# Patient Record
Sex: Female | Born: 1975 | Race: Black or African American | Hispanic: No | Smoking: Former smoker
Health system: Southern US, Community
[De-identification: ages and names within clinical notes are randomized; demographics above are authoritative.]

## PROBLEM LIST (undated history)

## (undated) DIAGNOSIS — I10 Essential (primary) hypertension: Secondary | ICD-10-CM

## (undated) DIAGNOSIS — M1990 Unspecified osteoarthritis, unspecified site: Secondary | ICD-10-CM

## (undated) HISTORY — PX: TUBAL LIGATION: SHX77

---

## 2003-09-22 ENCOUNTER — Emergency Department (HOSPITAL_COMMUNITY): Admission: EM | Admit: 2003-09-22 | Discharge: 2003-09-22 | Payer: Self-pay | Admitting: Emergency Medicine

## 2003-09-24 ENCOUNTER — Emergency Department (HOSPITAL_COMMUNITY): Admission: EM | Admit: 2003-09-24 | Discharge: 2003-09-24 | Payer: Self-pay | Admitting: Emergency Medicine

## 2005-10-19 ENCOUNTER — Other Ambulatory Visit: Admission: RE | Admit: 2005-10-19 | Discharge: 2005-10-19 | Payer: Self-pay | Admitting: Obstetrics and Gynecology

## 2005-10-25 ENCOUNTER — Ambulatory Visit (HOSPITAL_COMMUNITY): Admission: RE | Admit: 2005-10-25 | Discharge: 2005-10-25 | Payer: Self-pay | Admitting: Obstetrics and Gynecology

## 2014-01-28 ENCOUNTER — Emergency Department (HOSPITAL_COMMUNITY)
Admission: EM | Admit: 2014-01-28 | Discharge: 2014-01-28 | Disposition: A | Discharge status: Home / Self Care | Payer: Managed Care, Other (non HMO) | Attending: Emergency Medicine | Admitting: Emergency Medicine

## 2014-01-28 ENCOUNTER — Encounter (HOSPITAL_COMMUNITY): Payer: Self-pay | Admitting: Emergency Medicine

## 2014-01-28 DIAGNOSIS — M545 Low back pain, unspecified: Secondary | ICD-10-CM | POA: Insufficient documentation

## 2014-01-28 DIAGNOSIS — I10 Essential (primary) hypertension: Secondary | ICD-10-CM | POA: Insufficient documentation

## 2014-01-28 DIAGNOSIS — Z87891 Personal history of nicotine dependence: Secondary | ICD-10-CM | POA: Insufficient documentation

## 2014-01-28 DIAGNOSIS — R109 Unspecified abdominal pain: Secondary | ICD-10-CM | POA: Insufficient documentation

## 2014-01-28 DIAGNOSIS — Z7982 Long term (current) use of aspirin: Secondary | ICD-10-CM | POA: Insufficient documentation

## 2014-01-28 DIAGNOSIS — Z3202 Encounter for pregnancy test, result negative: Secondary | ICD-10-CM | POA: Insufficient documentation

## 2014-01-28 DIAGNOSIS — M549 Dorsalgia, unspecified: Secondary | ICD-10-CM

## 2014-01-28 DIAGNOSIS — Z79899 Other long term (current) drug therapy: Secondary | ICD-10-CM | POA: Insufficient documentation

## 2014-01-28 HISTORY — DX: Essential (primary) hypertension: I10

## 2014-01-28 LAB — URINALYSIS, ROUTINE W REFLEX MICROSCOPIC
Bilirubin Urine: NEGATIVE
Glucose, UA: NEGATIVE mg/dL
Hgb urine dipstick: NEGATIVE
Ketones, ur: NEGATIVE mg/dL
Leukocytes, UA: NEGATIVE
Nitrite: NEGATIVE
Protein, ur: NEGATIVE mg/dL
Specific Gravity, Urine: 1.025 (ref 1.005–1.030)
Urobilinogen, UA: 0.2 mg/dL (ref 0.0–1.0)
pH: 6 (ref 5.0–8.0)

## 2014-01-28 LAB — PREGNANCY, URINE: PREG TEST UR: NEGATIVE

## 2014-01-28 MED ORDER — IBUPROFEN 800 MG PO TABS
800.0000 mg | ORAL_TABLET | Freq: Once | ORAL | Status: AC
  Administered 2014-01-28: 800 mg via ORAL
  Filled 2014-01-28: qty 1

## 2014-01-28 MED ORDER — CYCLOBENZAPRINE HCL 10 MG PO TABS: 10.0000 mg | ORAL_TABLET | Freq: Two times a day (BID) | ORAL | Status: DC | PRN

## 2014-01-28 NOTE — ED Notes (Signed)
Pt c/o sharp pain in back for 3 days increased in severity last pm, now decreased. Denies trauma or urinary symptoms. MAE randomly.

## 2014-01-28 NOTE — ED Provider Notes (Signed)
CSN: [id number]     Arrival date & time 01/28/14  0610 History   First MD Initiated Contact with Patient 01/28/14 [phone]     Chief Complaint  Patient presents with  . Back Pain   (Consider location/radiation/quality/duration/timing/severity/associated sxs/prior Treatment) HPI Comments: Patient is a 38 yo F PMHx significant for HTN and uterine fibroids presenting to the ED for three days of waxing and waning low back pain without known precipitating factor. Patient states her pain is worse on the right side. She describes it as dull and achy with occasional sharp shooting pain. She rates her current pain 7/10. Alleviating factors include using a heating pad and sitting up. Patient states her pain is worsened with laying flat and bending over. Denies any numbness or weakness in her extremities, bladder or bowel incontinence, fevers or chills, abdominal pain, nausea, vomiting, diarrhea, vaginal bleeding or discharge, dysuria, urinary frequency or urgency, hx of IVDA or cancer. LMP end of December. Hx of irregular periods.   Patient is a 38 y.o. female presenting with back pain.  Back Pain Associated symptoms: no chest pain and no fever     Past Medical History  Diagnosis Date  . Hypertension    History reviewed. No pertinent past surgical history. Family History  Problem Relation Age of Onset  . Hypertension Mother   . Diabetes Mother   . Heart failure Father    History  Substance Use Topics  . Smoking status: Former Games developer  . Smokeless tobacco: Not on file  . Alcohol Use: 1.2 oz/week    2 Glasses of wine per week   OB History   Grav Para Term Preterm Abortions TAB SAB Ect Mult Living   3 2   1           Review of Systems  Constitutional: Negative for fever and chills.  Respiratory: Negative for shortness of breath.   Cardiovascular: Negative for chest pain.  Gastrointestinal: Negative for nausea and vomiting.  Musculoskeletal: Positive for back pain.  All other systems reviewed  and are negative.    Allergies  Review of patient's allergies indicates no known allergies.  Home Medications   Current Outpatient Rx  Name  Route  Sig  Dispense  Refill  . amLODipine (NORVASC) 10 MG tablet   Oral   Take 10 mg by mouth daily.         Marland Kitchen aspirin EC 81 MG tablet   Oral   Take 81 mg by mouth daily.         . hydrochlorothiazide (HYDRODIURIL) 25 MG tablet   Oral   Take 25 mg by mouth daily.         . cyclobenzaprine (FLEXERIL) 10 MG tablet   Oral   Take 1 tablet (10 mg total) by mouth 2 (two) times daily as needed for muscle spasms.   20 tablet   0    BP 149/86  Pulse 96  Temp(Src) 98.7 F (37.1 C) (Oral)  Resp 16  Ht 5\' 3"  (1.6 m)  Wt 223 lb (101.152 kg)  BMI 39.51 kg/m2  SpO2 98%  LMP 12/24/2013 Physical Exam  Constitutional: She is oriented to person, place, and time. She appears well-developed and well-nourished. No distress.  HENT:  Head: Normocephalic and atraumatic.  Right Ear: External ear normal.  Left Ear: External ear normal.  Nose: Nose normal.  Mouth/Throat: Oropharynx is clear and moist. No oropharyngeal exudate.  Eyes: Conjunctivae and EOM are normal. Pupils are equal, round, and reactive to  light.  Neck: Normal range of motion. Neck supple.  Cardiovascular: Normal rate, regular rhythm, normal heart sounds and intact distal pulses.   Pulmonary/Chest: Effort normal and breath sounds normal. No respiratory distress.  Abdominal: Soft. There is no tenderness. There is CVA tenderness (R sided). There is no rigidity, no rebound and no guarding.  Neurological: She is alert and oriented to person, place, and time. She has normal strength. No cranial nerve deficit or sensory deficit. Gait normal. GCS eye subscore is 4. GCS verbal subscore is 5. GCS motor subscore is 6.  No pronator drift. Bilateral heel-knee-shin intact.  Skin: Skin is warm and dry. She is not diaphoretic.    ED Course  Procedures (including critical care  time) Medications  ibuprofen (ADVIL,MOTRIN) tablet 800 mg (800 mg Oral Given 01/28/14 0738)   Labs Review Labs Reviewed  URINALYSIS, ROUTINE W REFLEX MICROSCOPIC  PREGNANCY, URINE   Imaging Review No results found.  EKG Interpretation   None       MDM   1. Back pain     Filed Vitals:   01/28/14 0616  BP: 149/86  Pulse: 96  Temp: 98.7 F (37.1 C)  Resp: 16   Afebrile, NAD, non-toxic appearing, AAOx4. Patient with back pain.  No neurological deficits and normal neuro exam.  Patient can walk but states is painful.  No loss of bowel or bladder control.  No concern for cauda equina.  No fever, night sweats, weight loss, h/o cancer, IVDU.  Urine reviewed. RICE protocol and pain medicine indicated and discussed with patient. Return precautions discussed. Patient is agreeable to plan. Patient is stable at time of discharge        Jeannetta EllisJennifer L Jackalyn Haith, PA-C 01/28/14 1259

## 2014-01-28 NOTE — Discharge Instructions (Signed)
Please follow up with your primary care physician in 1-2 days. If you do not have one please call the [facility] number listed above. Please take medications as prescribed. Please be aware the Flexeril may make you drowsy. Please read all discharge instructions and return precautions.   Back Pain, Adult Low back pain is very common. About 1 in 5 people have back pain.The cause of low back pain is rarely dangerous. The pain often gets better over time.About half of people with a sudden onset of back pain feel better in just 2 weeks. About 8 in 10 people feel better by 6 weeks.  CAUSES Some common causes of back pain include:  Strain of the muscles or ligaments supporting the spine.  Wear and tear (degeneration) of the spinal discs.  Arthritis.  Direct injury to the back. DIAGNOSIS Most of the time, the direct cause of low back pain is not known.However, back pain can be treated effectively even when the exact cause of the pain is unknown.Answering your caregiver's questions about your overall health and symptoms is one of the most accurate ways to make sure the cause of your pain is not dangerous. If your caregiver needs more information, he or she may order lab work or imaging tests (X-rays or MRIs).However, even if imaging tests show changes in your back, this usually does not require surgery. HOME CARE INSTRUCTIONS For many people, back pain returns.Since low back pain is rarely dangerous, it is often a condition that people can learn to [facility] their own.   Remain active. It is stressful on the back to sit or stand in one place. Do not sit, drive, or stand in one place for more than 30 minutes at a time. Take short walks on level surfaces as soon as pain allows.Try to increase the length of time you walk each day.  Do not stay in bed.Resting more than 1 or 2 days can delay your recovery.  Do not avoid exercise or work.Your body is made to move.It is not  dangerous to be active, even though your back may hurt.Your back will likely heal faster if you return to being active before your pain is gone.  Pay attention to your body when you bend and lift. Many people have less discomfortwhen lifting if they bend their knees, keep the load close to their bodies,and avoid twisting. Often, the most comfortable positions are those that put less stress on your recovering back.  Find a comfortable position to sleep. Use a firm mattress and lie on your side with your knees slightly bent. If you lie on your back, put a pillow under your knees.  Only take over-the-counter or prescription medicines as directed by your caregiver. Over-the-counter medicines to reduce pain and inflammation are often the most helpful.Your caregiver may prescribe muscle relaxant drugs.These medicines help dull your pain so you can more quickly return to your normal activities and healthy exercise.  Put ice on the injured area.  Put ice in a plastic bag.  Place a towel between your skin and the bag.  Leave the ice on for 15-20 minutes, 03-04 times a day for the first 2 to 3 days. After that, ice and heat may be alternated to reduce pain and spasms.  Ask your caregiver about trying back exercises and gentle massage. This may be of some benefit.  Avoid feeling anxious or stressed.Stress increases muscle tension and can worsen back pain.It is important to recognize when you are anxious  or stressed and learn ways to manage it.Exercise is a great option. SEEK MEDICAL CARE IF:  You have pain that is not relieved with rest or medicine.  You have pain that does not improve in 1 week.  You have new symptoms.  You are generally not feeling well. SEEK IMMEDIATE MEDICAL CARE IF:   You have pain that radiates from your back into your legs.  You develop new bowel or bladder control problems.  You have unusual weakness or numbness in your arms or legs.  You develop nausea or  vomiting.  You develop abdominal pain.  You feel faint. Document Released: 12/13/2005 Document Revised: 06/13/2012 Document Reviewed: 05/03/2011 [facility] Patient Information 2014 [employer], [place].

## 2014-01-30 NOTE — ED Provider Notes (Signed)
Medical screening examination/treatment/procedure(s) were performed by non-physician practitioner and as supervising physician I was immediately available for consultation/collaboration.   Jamarkis Branam, MD 01/30/14 1340 

## 2014-10-28 ENCOUNTER — Encounter (HOSPITAL_COMMUNITY): Payer: Self-pay | Admitting: Emergency Medicine

## 2019-12-04 ENCOUNTER — Other Ambulatory Visit: Payer: Self-pay

## 2019-12-04 DIAGNOSIS — Z20822 Contact with and (suspected) exposure to covid-19: Secondary | ICD-10-CM

## 2019-12-06 LAB — NOVEL CORONAVIRUS, NAA: SARS-CoV-2, NAA: NOT DETECTED

## 2020-03-06 ENCOUNTER — Other Ambulatory Visit: Payer: Self-pay | Admitting: Physician Assistant

## 2020-03-06 DIAGNOSIS — Z1231 Encounter for screening mammogram for malignant neoplasm of breast: Secondary | ICD-10-CM

## 2020-03-26 ENCOUNTER — Other Ambulatory Visit: Payer: Self-pay

## 2020-03-26 ENCOUNTER — Ambulatory Visit
Admission: RE | Admit: 2020-03-26 | Discharge: 2020-03-26 | Disposition: A | Discharge status: Home / Self Care | Payer: BC Managed Care – PPO | Source: Ambulatory Visit | Attending: Physician Assistant | Admitting: Physician Assistant

## 2020-03-26 DIAGNOSIS — Z1231 Encounter for screening mammogram for malignant neoplasm of breast: Secondary | ICD-10-CM

## 2022-01-28 ENCOUNTER — Other Ambulatory Visit: Payer: Self-pay

## 2022-01-28 ENCOUNTER — Emergency Department (HOSPITAL_BASED_OUTPATIENT_CLINIC_OR_DEPARTMENT_OTHER)
Admission: EM | Admit: 2022-01-28 | Discharge: 2022-01-28 | Disposition: A | Discharge status: Home / Self Care | Payer: Managed Care, Other (non HMO) | Attending: Emergency Medicine | Admitting: Emergency Medicine

## 2022-01-28 ENCOUNTER — Encounter (HOSPITAL_BASED_OUTPATIENT_CLINIC_OR_DEPARTMENT_OTHER): Payer: Self-pay | Admitting: *Deleted

## 2022-01-28 DIAGNOSIS — Z7982 Long term (current) use of aspirin: Secondary | ICD-10-CM | POA: Diagnosis not present

## 2022-01-28 DIAGNOSIS — Z79899 Other long term (current) drug therapy: Secondary | ICD-10-CM | POA: Diagnosis not present

## 2022-01-28 DIAGNOSIS — I10 Essential (primary) hypertension: Secondary | ICD-10-CM | POA: Diagnosis present

## 2022-01-28 DIAGNOSIS — E876 Hypokalemia: Secondary | ICD-10-CM | POA: Insufficient documentation

## 2022-01-28 LAB — CBC WITH DIFFERENTIAL/PLATELET
Abs Immature Granulocytes: 0.01 10*3/uL (ref 0.00–0.07)
Basophils Absolute: 0 10*3/uL (ref 0.0–0.1)
Basophils Relative: 1 %
Eosinophils Absolute: 0.2 10*3/uL (ref 0.0–0.5)
Eosinophils Relative: 4 %
HCT: 36.8 % (ref 36.0–46.0)
Hemoglobin: 11.5 g/dL — ABNORMAL LOW (ref 12.0–15.0)
Immature Granulocytes: 0 %
Lymphocytes Relative: 33 %
Lymphs Abs: 1.8 10*3/uL (ref 0.7–4.0)
MCH: 23.6 pg — ABNORMAL LOW (ref 26.0–34.0)
MCHC: 31.3 g/dL (ref 30.0–36.0)
MCV: 75.4 fL — ABNORMAL LOW (ref 80.0–100.0)
Monocytes Absolute: 0.6 10*3/uL (ref 0.1–1.0)
Monocytes Relative: 11 %
Neutro Abs: 2.9 10*3/uL (ref 1.7–7.7)
Neutrophils Relative %: 51 %
Platelets: 386 10*3/uL (ref 150–400)
RBC: 4.88 MIL/uL (ref 3.87–5.11)
RDW: 15.1 % (ref 11.5–15.5)
WBC: 5.5 10*3/uL (ref 4.0–10.5)
nRBC: 0 % (ref 0.0–0.2)

## 2022-01-28 LAB — TROPONIN I (HIGH SENSITIVITY): Troponin I (High Sensitivity): 8 ng/L (ref <18)

## 2022-01-28 LAB — BASIC METABOLIC PANEL
Anion gap: 8 (ref 5–15)
BUN: 18 mg/dL (ref 6–20)
CO2: 28 mmol/L (ref 22–32)
Calcium: 8.8 mg/dL — ABNORMAL LOW (ref 8.9–10.3)
Chloride: 100 mmol/L (ref 98–111)
Creatinine, Ser: 0.97 mg/dL (ref 0.44–1.00)
GFR, Estimated: >60 mL/min (ref >60)
Glucose, Bld: 126 mg/dL — ABNORMAL HIGH (ref 70–99)
Potassium: 2.9 mmol/L — ABNORMAL LOW (ref 3.5–5.1)
Sodium: 136 mmol/L (ref 135–145)

## 2022-01-28 MED ORDER — LISINOPRIL 10 MG PO TABS
20.0000 mg | ORAL_TABLET | Freq: Once | ORAL | Status: AC
  Administered 2022-01-28: 20 mg via ORAL
  Filled 2022-01-28: qty 2

## 2022-01-28 MED ORDER — HYDROCHLOROTHIAZIDE 25 MG PO TABS
25.0000 mg | ORAL_TABLET | Freq: Once | ORAL | Status: AC
  Administered 2022-01-28: 25 mg via ORAL
  Filled 2022-01-28: qty 1

## 2022-01-28 MED ORDER — AMLODIPINE BESYLATE 5 MG PO TABS
5.0000 mg | ORAL_TABLET | Freq: Once | ORAL | Status: AC
  Administered 2022-01-28: 5 mg via ORAL
  Filled 2022-01-28: qty 1

## 2022-01-28 MED ORDER — LISINOPRIL-HYDROCHLOROTHIAZIDE 20-25 MG PO TABS: 1.0000 | ORAL_TABLET | Freq: Every day | ORAL | 0 refills | Status: DC

## 2022-01-28 MED ORDER — POTASSIUM CHLORIDE CRYS ER 20 MEQ PO TBCR
40.0000 meq | EXTENDED_RELEASE_TABLET | Freq: Once | ORAL | Status: AC
  Administered 2022-01-28: 40 meq via ORAL
  Filled 2022-01-28: qty 2

## 2022-01-28 MED ORDER — POTASSIUM CHLORIDE ER 10 MEQ PO TBCR: 10.0000 meq | EXTENDED_RELEASE_TABLET | Freq: Every day | ORAL | 0 refills | Status: DC

## 2022-01-28 MED ORDER — AMLODIPINE BESYLATE 5 MG PO TABS: 5.0000 mg | ORAL_TABLET | Freq: Every day | ORAL | 0 refills | Status: DC

## 2022-01-28 NOTE — Discharge Instructions (Signed)
Please pick up blood pressure medication and take as prescribed daily.   Keep appointment with your PCP on Monday for further evaluation. You should also have your potassium level rechecked at that time given it was slightly low today. Take the oral potassium supplements prescribed to you.   Return to the ED for any new/worsening symptoms

## 2022-01-28 NOTE — ED Provider Notes (Signed)
[facility] EMERGENCY DEPARTMENT Provider Note   CSN: [id number] Arrival date & time: 01/28/22  1808     History  Chief Complaint  Patient presents with   Hypertension    Martha Thomas is a 46 y.o. female with Pmhx HTN who presents to the ED from PCP's office for high blood pressure.  Patient reports she had issues with insurance and ran out of her medication 6 months ago.  She recently got a new job and got new insurance.  She went to her PCP today was noted to be hypertensive and sent to the ED for further evaluation.  Blood pressure 229/131 on arrival today.  Patient has no complaints at this time.  She denies any headache, vision changes, chest pain, shortness of breath, abdominal pain, urinary symptoms, any other associated symptoms.  The history is provided by the patient and medical records.      Home Medications Prior to Admission medications   Medication Sig Start Date End Date Taking? Authorizing Provider  amLODipine (NORVASC) 5 MG tablet Take 1 tablet (5 mg total) by mouth daily. 01/28/22 02/27/22 Yes Jordana Dugue, PA-C  lisinopril-hydrochlorothiazide (ZESTORETIC) 20-25 MG tablet Take 1 tablet by mouth daily. 01/28/22 02/27/22 Yes Buffey Zabinski, PA-C  potassium chloride (KLOR-CON) 10 MEQ tablet Take 1 tablet (10 mEq total) by mouth daily for 5 days. 01/28/22 02/02/22 Yes Callan Norden, PA-C  amLODipine (NORVASC) 10 MG tablet Take 10 mg by mouth daily.    [provider]  aspirin EC 81 MG tablet Take 81 mg by mouth daily.    [provider]  cyclobenzaprine (FLEXERIL) 10 MG tablet Take 1 tablet (10 mg total) by mouth 2 (two) times daily as needed for muscle spasms. 01/28/14   Piepenbrink, Anderson Malta, PA-C  hydrochlorothiazide (HYDRODIURIL) 25 MG tablet Take 25 mg by mouth daily.    [provider]      Allergies    Patient has no known allergies.    Review of Systems   Review of Systems  Constitutional:  Negative for chills and fever.   Eyes:  Negative for visual disturbance.  Respiratory:  Negative for shortness of breath.   Cardiovascular:  Negative for chest pain.  Gastrointestinal:  Negative for abdominal pain, nausea and vomiting.  Neurological:  Negative for dizziness and headaches.  All other systems reviewed and are negative.  Physical Exam Updated Vital Signs BP (!) 191/109    Pulse 84    Temp 99 F (37.2 C) (Oral)    Resp 18    Ht 5\' 3"  (1.6 m)    Wt 98.9 kg    LMP 01/21/2022    SpO2 100%    BMI 38.62 kg/m  Physical Exam Vitals and nursing note reviewed.  Constitutional:      Appearance: She is not ill-appearing or diaphoretic.  HENT:     Head: Normocephalic and atraumatic.  Eyes:     Conjunctiva/sclera: Conjunctivae normal.  Cardiovascular:     Rate and Rhythm: Normal rate and regular rhythm.     Pulses: Normal pulses.  Pulmonary:     Effort: Pulmonary effort is normal.     Breath sounds: Normal breath sounds. No wheezing, rhonchi or rales.  Abdominal:     Palpations: Abdomen is soft.     Tenderness: There is no abdominal tenderness.  Musculoskeletal:     Cervical back: Neck supple.  Skin:    General: Skin is warm and dry.  Neurological:     Mental Status: She  is alert.    ED Results / Procedures / Treatments   Labs (all labs ordered are listed, but only abnormal results are displayed) Labs Reviewed  CBC WITH DIFFERENTIAL/PLATELET - Abnormal; Notable for the following components:      Result Value   Hemoglobin 11.5 (*)    MCV 75.4 (*)    MCH 23.6 (*)    All other components within normal limits  BASIC METABOLIC PANEL - Abnormal; Notable for the following components:   Potassium 2.9 (*)    Glucose, Bld 126 (*)    Calcium 8.8 (*)    All other components within normal limits  TROPONIN I (HIGH SENSITIVITY)  TROPONIN I (HIGH SENSITIVITY)    EKG None  Radiology No results found.  Procedures Procedures    Medications Ordered in ED Medications  amLODipine (NORVASC) tablet 5 mg  (5 mg Oral Given 01/28/22 1840)  lisinopril (ZESTRIL) tablet 20 mg (20 mg Oral Given 01/28/22 1839)  hydrochlorothiazide (HYDRODIURIL) tablet 25 mg (25 mg Oral Given 01/28/22 1840)  potassium chloride SA (KLOR-CON M) CR tablet 40 mEq (40 mEq Oral Given 01/28/22 1931)    ED Course/ Medical Decision Making/ A&P                           Medical Decision Making 46 year old female presenting to the ED with elevated blood pressure readings. Have been off of her Amlodipine 5 mg and Lisinopril HCTZ 20-25 mg for 6 months. Asymptomatic at this time. On arrival to the ED BP 229/131. Will workup for asymptomatic HTN/hypertensive urgency at this time with CBC, BMP, troponin. Will provide doses of amlodipine, lisinopril, and HCTZ.   Problems Addressed: Hypokalemia: acute illness or injury    Details: Provided with KDUR in the ED and discharged home with same. Pt instructed to follow up with PCP next week for recheck of potassium. Primary hypertension: acute illness or injury    Details: Significantly elevated in the ED today however improved from preavious at 229/131 to 180/94. Suspect related to being off of BP meds for > 6 months. No signs of acute organ damage and pt without symptoms at this time to suggest HTN urgency/emergency. Pt to be started back on her amlodipine 5 mg and lisinopril hctz 20-25 mg with PCP follow up.  Amount and/or Complexity of Data Reviewed Labs: ordered.    Details: CBC without leukocytosis. Hgb stable at 11.5 CMP with potassium 2.9. No other electrolyte abnormalities. KDUR provided in the ED. Pt without any vomiting or diarrhea to suggest GI loss.  Troponin of 8. Do not feel pt requires repeat troponin testing. ECG/medicine tests: ordered.    Details: EKG with nonspecific ST changes. Pt without complaints of chest pain.  Risk Prescription drug management.          Final Clinical Impression(s) / ED Diagnoses Final diagnoses:  Primary hypertension  Hypokalemia    Rx /  DC Orders ED Discharge Orders          Ordered    amLODipine (NORVASC) 5 MG tablet  Daily        01/28/22 2020    lisinopril-hydrochlorothiazide (ZESTORETIC) 20-25 MG tablet  Daily        01/28/22 2020    potassium chloride (KLOR-CON) 10 MEQ tablet  Daily        01/28/22 2020             Discharge Instructions      Please  pick up blood pressure medication and take as prescribed daily.   Keep appointment with your PCP on Monday for further evaluation. You should also have your potassium level rechecked at that time given it was slightly low today. Take the oral potassium supplements prescribed to you.   Return to the ED for any new/worsening symptoms        Eustaquio Maize, Hershal Coria 01/28/22 2021    Varney Biles, MD 01/30/22 1421

## 2022-01-28 NOTE — ED Triage Notes (Signed)
She ran out her BP medications over 6 months ago. She was seen by her MD today for a check up and her BP was elevated. She was told to come to the ER. No symptoms. EKG at triage.

## 2022-03-28 ENCOUNTER — Emergency Department (HOSPITAL_BASED_OUTPATIENT_CLINIC_OR_DEPARTMENT_OTHER): Payer: Managed Care, Other (non HMO)

## 2022-03-28 ENCOUNTER — Emergency Department (HOSPITAL_BASED_OUTPATIENT_CLINIC_OR_DEPARTMENT_OTHER)
Admission: EM | Admit: 2022-03-28 | Discharge: 2022-03-28 | Disposition: A | Discharge status: Home / Self Care | Payer: Managed Care, Other (non HMO) | Attending: Emergency Medicine | Admitting: Emergency Medicine

## 2022-03-28 ENCOUNTER — Encounter (HOSPITAL_BASED_OUTPATIENT_CLINIC_OR_DEPARTMENT_OTHER): Payer: Self-pay | Admitting: Emergency Medicine

## 2022-03-28 ENCOUNTER — Other Ambulatory Visit: Payer: Self-pay

## 2022-03-28 DIAGNOSIS — M25561 Pain in right knee: Secondary | ICD-10-CM

## 2022-03-28 DIAGNOSIS — M25461 Effusion, right knee: Secondary | ICD-10-CM | POA: Diagnosis not present

## 2022-03-28 DIAGNOSIS — X500XXA Overexertion from strenuous movement or load, initial encounter: Secondary | ICD-10-CM | POA: Insufficient documentation

## 2022-03-28 DIAGNOSIS — Y990 Civilian activity done for income or pay: Secondary | ICD-10-CM | POA: Diagnosis not present

## 2022-03-28 DIAGNOSIS — Z7982 Long term (current) use of aspirin: Secondary | ICD-10-CM | POA: Insufficient documentation

## 2022-03-28 MED ORDER — DEXAMETHASONE SODIUM PHOSPHATE 10 MG/ML IJ SOLN
10.0000 mg | Freq: Once | INTRAMUSCULAR | Status: AC
  Administered 2022-03-28: 10 mg via INTRAMUSCULAR
  Filled 2022-03-28: qty 1

## 2022-03-28 NOTE — ED Provider Notes (Signed)
?[facility] EMERGENCY DEPARTMENT ?Provider Note ? ? ?CSN: [id number] ?Arrival date & time: 03/28/22  1332 ? ?  ? ?History ? ?Chief Complaint  ?Patient presents with  ? Knee Pain  ? ? ?Martha Thomas is a 46 y.o. female presented emerged department with pain in her right knee.  Patient reports she has chronic pain in both knees due to osteoarthritis, sees an orthopedic doctor and was told that she may need a left knee replacement.  She tends to favor the right knee.  Yesterday at work she felt that her right knee "gave out".  She felt the knee had buckled.  She does work at [employer] and is reportedly up and down on her feet all day at work.  She has swelling and pain in the right knee.  In the past she has had steroid injections into the knee that provided some relief.  She takes Tylenol for pain, does not take ibuprofen because "it makes my blood pressure go up". ? ?HPI ? ?  ? ?Home Medications ?Prior to Admission medications   ?Medication Sig Start Date End Date Taking? Authorizing Provider  ?amLODipine (NORVASC) 10 MG tablet Take 10 mg by mouth daily.    [provider]  ?amLODipine (NORVASC) 5 MG tablet Take 1 tablet (5 mg total) by mouth daily. 01/28/22 02/27/22  Tanda Rockers, PA-C  ?aspirin EC 81 MG tablet Take 81 mg by mouth daily.    [provider]  ?cyclobenzaprine (FLEXERIL) 10 MG tablet Take 1 tablet (10 mg total) by mouth 2 (two) times daily as needed for muscle spasms. 01/28/14   Piepenbrink, Victorino Dike, PA-C  ?hydrochlorothiazide (HYDRODIURIL) 25 MG tablet Take 25 mg by mouth daily.    [provider]  ?lisinopril-hydrochlorothiazide (ZESTORETIC) 20-25 MG tablet Take 1 tablet by mouth daily. 01/28/22 02/27/22  Tanda Rockers, PA-C  ?potassium chloride (KLOR-CON) 10 MEQ tablet Take 1 tablet (10 mEq total) by mouth daily for 5 days. 01/28/22 02/02/22  Tanda Rockers, PA-C  ?   ? ?Allergies    ?Patient has no known allergies.   ? ?Review of Systems   ?Review of Systems ? ?Physical  Exam ?Updated Vital Signs ?BP (!) 152/84 (BP Location: Right Arm)   Pulse 75   Temp 98.2 ?F (36.8 ?C) (Oral)   Resp 15   SpO2 100%  ?Physical Exam ?Constitutional:   ?   General: She is not in acute distress. ?HENT:  ?   Head: Normocephalic and atraumatic.  ?Eyes:  ?   Conjunctiva/sclera: Conjunctivae normal.  ?   Pupils: Pupils are equal, round, and reactive to light.  ?Cardiovascular:  ?   Rate and Rhythm: Normal rate and regular rhythm.  ?Pulmonary:  ?   Effort: Pulmonary effort is normal. No respiratory distress.  ?Abdominal:  ?   Tenderness: There is no abdominal tenderness.  ?Musculoskeletal:  ?   Comments: [place] knee criteria negative, effusion of moderate size the right knee, full range of motion actively and passively with the right knee.  ?Skin: ?   General: Skin is warm and dry.  ?Neurological:  ?   General: No focal deficit present.  ?   Mental Status: She is alert. Mental status is at baseline.  ?Psychiatric:     ?   Mood and Affect: Mood normal.     ?   Behavior: Behavior normal.  ? ? ?ED Results / Procedures / Treatments   ?Labs ?(all labs ordered are listed, but only abnormal results are displayed) ?  Labs Reviewed - No data to display ? ?EKG ?None ? ?Radiology ?DG Knee Complete 4 Views Right ? ?Result Date: 03/28/2022 ?CLINICAL DATA:  Intermittent knee pain. EXAM: RIGHT KNEE - COMPLETE 4+ VIEW COMPARISON:  None. FINDINGS: No evidence of fracture or dislocation. Moderate suprapatellar joint effusion. Mild tricompartmental osteoarthritis. No focal osseous lesion. IMPRESSION: 1. No acute osseous abnormality. 2. Moderate suprapatellar joint effusion. 3. Mild tricompartmental osteoarthritis. Electronically Signed   By: Sherron Ales M.D.   On: 03/28/2022 14:14   ? ?Procedures ?Procedures  ? ? ?Medications Ordered in ED ?Medications  ?dexamethasone (DECADRON) injection 10 mg (10 mg Intramuscular Given 03/28/22 1458)  ? ? ?ED Course/ Medical Decision Making/ A&P ?  ?                        ?Medical Decision  Making ?Amount and/or Complexity of Data Reviewed ?Radiology: ordered. ? ?Risk ?Prescription drug management. ? ? ?Patient is here with right knee pain and effusion ?X-rays ordered at triage and personally reviewed showing tricompartmental osteoarthritis, moderate effusion, no evident fracture. ?Low suspicion for ACL or PCL tear, no laxity of the joint ?Highly doubt septic joint, no warmth or erythema or fevers, no significant tenderness or pain with range of motion testing. ? ?Advised a compressive sleeve for the right knee, we can give a Decadron shot for additional pain control, advised ice and elevation and compression at home.  A work note will be provided so she can rest this knee.  She will follow-up with the orthopedic doctor.  She verbalized understanding ? ? ? ? ? ? ? ?Final Clinical Impression(s) / ED Diagnoses ?Final diagnoses:  ?Acute pain of right knee  ?Effusion of right knee  ? ? ?Rx / DC Orders ?ED Discharge Orders   ? ? None  ? ?  ? ? ?  ?Terald Sleeper, MD ?03/28/22 1525 ? ?

## 2022-03-28 NOTE — ED Triage Notes (Signed)
Pt reports intermittent R knee pain "for a while." Yesterday knee gave out on her. Pt ambulatory to triage. ?

## 2022-03-28 NOTE — Discharge Instructions (Signed)
You should put ice on your knee at home and try to elevate it, using ice or cold packs for 10 minutes at a time.  Follow-up with your orthopedic doctor in the office tomorrow.  You can take Tylenol at home as needed for pain, we gave you a shot of steroids this may also help with inflammation and pain.  Please consider buying a compressive sleeve for support sleeve for your knee over-the-counter at a pharmacy. ?

## 2023-07-14 ENCOUNTER — Other Ambulatory Visit: Payer: Self-pay

## 2023-07-14 ENCOUNTER — Encounter (HOSPITAL_BASED_OUTPATIENT_CLINIC_OR_DEPARTMENT_OTHER): Payer: Self-pay

## 2023-07-14 ENCOUNTER — Emergency Department (HOSPITAL_BASED_OUTPATIENT_CLINIC_OR_DEPARTMENT_OTHER)
Admission: EM | Admit: 2023-07-14 | Discharge: 2023-07-14 | Disposition: A | Discharge status: Home / Self Care | Payer: 59 | Attending: Emergency Medicine | Admitting: Emergency Medicine

## 2023-07-14 DIAGNOSIS — Z20822 Contact with and (suspected) exposure to covid-19: Secondary | ICD-10-CM | POA: Insufficient documentation

## 2023-07-14 DIAGNOSIS — H6691 Otitis media, unspecified, right ear: Secondary | ICD-10-CM | POA: Diagnosis not present

## 2023-07-14 DIAGNOSIS — H9201 Otalgia, right ear: Secondary | ICD-10-CM | POA: Diagnosis present

## 2023-07-14 DIAGNOSIS — Z7982 Long term (current) use of aspirin: Secondary | ICD-10-CM | POA: Insufficient documentation

## 2023-07-14 DIAGNOSIS — J029 Acute pharyngitis, unspecified: Secondary | ICD-10-CM | POA: Insufficient documentation

## 2023-07-14 LAB — RESP PANEL BY RT-PCR (RSV, FLU A&B, COVID)  RVPGX2
Influenza A by PCR: NEGATIVE
Influenza B by PCR: NEGATIVE
Resp Syncytial Virus by PCR: NEGATIVE
SARS Coronavirus 2 by RT PCR: NEGATIVE

## 2023-07-14 LAB — GROUP A STREP BY PCR: Group A Strep by PCR: NOT DETECTED

## 2023-07-14 MED ORDER — AMOXICILLIN 500 MG PO CAPS
500.0000 mg | ORAL_CAPSULE | Freq: Once | ORAL | Status: AC
  Administered 2023-07-14: 500 mg via ORAL
  Filled 2023-07-14: qty 1

## 2023-07-14 MED ORDER — AMOXICILLIN 500 MG PO CAPS: 500.0000 mg | ORAL_CAPSULE | Freq: Three times a day (TID) | ORAL | 0 refills | Status: DC

## 2023-07-14 NOTE — Discharge Instructions (Addendum)
Return if any problems.  See your Physician for recheck in 1 week   °

## 2023-07-14 NOTE — ED Triage Notes (Signed)
Patient here POV from Home.  Endorses Sore Throat, Bilateral Otalgia, Productive Cough, headache for about 1 week. No Aches or Fever.   NAD Noted during Triage. A&Ox4. Gcs 15. Ambulatory.

## 2023-07-15 NOTE — ED Provider Notes (Signed)
[place] EMERGENCY DEPARTMENT AT [facility] Provider Note   CSN: [id number] Arrival date & time: 07/14/23  2016     History  Chief Complaint  Patient presents with   Sore Throat    Martha Thomas is a 47 y.o. female.  Patient complains of a sore throat and right ear pain.  Patient reports that she has had symptoms for the past week.  Patient was exposed to a child with a viral illness.  Patient reports no fever no chills.  She does not have any shortness of breath.  The history is provided by the patient. No language interpreter was used.  Sore Throat This is a new problem. The problem occurs constantly. Nothing aggravates the symptoms. Nothing relieves the symptoms. She has tried nothing for the symptoms.       Home Medications Prior to Admission medications   Medication Sig Start Date End Date Taking? Authorizing Provider  amoxicillin (AMOXIL) 500 MG capsule Take 1 capsule (500 mg total) by mouth 3 (three) times daily. 07/14/23  Yes Cheron Schaumann K, PA-C  amLODipine (NORVASC) 10 MG tablet Take 10 mg by mouth daily.    [provider]  amLODipine (NORVASC) 5 MG tablet Take 1 tablet (5 mg total) by mouth daily. 01/28/22 02/27/22  Tanda Rockers, PA-C  aspirin EC 81 MG tablet Take 81 mg by mouth daily.    [provider]  cyclobenzaprine (FLEXERIL) 10 MG tablet Take 1 tablet (10 mg total) by mouth 2 (two) times daily as needed for muscle spasms. 01/28/14   Piepenbrink, Victorino Dike, PA-C  hydrochlorothiazide (HYDRODIURIL) 25 MG tablet Take 25 mg by mouth daily.    [provider]  lisinopril-hydrochlorothiazide (ZESTORETIC) 20-25 MG tablet Take 1 tablet by mouth daily. 01/28/22 02/27/22  Hyman Hopes, Margaux, PA-C  potassium chloride (KLOR-CON) 10 MEQ tablet Take 1 tablet (10 mEq total) by mouth daily for 5 days. 01/28/22 02/02/22  Tanda Rockers, PA-C      Allergies    Patient has no known allergies.    Review of Systems   Review of Systems  HENT:   Positive for ear pain and sore throat.   All other systems reviewed and are negative.   Physical Exam Updated Vital Signs BP (!) 182/111   Pulse 79   Temp 98.8 F (37.1 C)   Resp 20   Ht 5\' 3"  (1.6 m)   Wt 103.4 kg   SpO2 99%   BMI 40.39 kg/m  Physical Exam Vitals and nursing note reviewed.  Constitutional:      Appearance: She is well-developed.  HENT:     Head: Normocephalic.     Right Ear: Tympanic membrane is erythematous.     Mouth/Throat:     Pharynx: Pharyngeal swelling and posterior oropharyngeal erythema present.     Tonsils: No tonsillar exudate.  Pulmonary:     Effort: Pulmonary effort is normal.  Abdominal:     General: There is no distension.     Palpations: Abdomen is soft.  Musculoskeletal:        General: Normal range of motion.     Cervical back: Normal range of motion.  Skin:    General: Skin is warm.  Neurological:     Mental Status: She is alert and oriented to person, place, and time.  Psychiatric:        Mood and Affect: Mood normal.     ED Results / Procedures / Treatments   Labs (all labs ordered are listed, but only  abnormal results are displayed) Labs Reviewed  GROUP A STREP BY PCR  RESP PANEL BY RT-PCR (RSV, FLU A&B, COVID)  [id number]    EKG None  Radiology No results found.  Procedures Procedures    Medications Ordered in ED Medications  amoxicillin (AMOXIL) capsule 500 mg (500 mg Oral Given 07/14/23 2147)    ED Course/ Medical Decision Making/ A&P Clinical Course as of 07/15/23 1638  Thu Jul 14, 2023  2045 Resp panel by RT-PCR (RSV, Flu A&B, Covid) Anterior Nasal Swab [NF]  2045 Group A Strep by PCR [NF]    Clinical Course User Index [NF] Rejeana Brock, [place]                             Medical Decision Making Patient complains of a sore throat for the past week.  Patient complains of pain in her right ear  Amount and/or Complexity of Data Reviewed Labs: ordered. Decision-making details documented in ED  Course.    Details: Labs ordered reviewed and interpreted strep is negative.  Flu and COVID are negative  Risk Prescription drug management. Risk Details: Patient has a bulging red right tympanic membrane.  I discussed findings with patient I will treat her with amoxicillin 500 mg for 10 days.  Patient is advised to follow-up with her primary care physician for recheck.           Final Clinical Impression(s) / ED Diagnoses Final diagnoses:  Right otitis media, unspecified otitis media type    Rx / DC Orders ED Discharge Orders          Ordered    amoxicillin (AMOXIL) 500 MG capsule  3 times daily        07/14/23 2126           An After Visit Summary was printed and given to the patient.    Elson Areas, [place] 07/15/23 1642    Glyn Ade, MD 07/18/23 [phone]

## 2023-09-11 ENCOUNTER — Other Ambulatory Visit: Payer: Self-pay

## 2023-09-11 ENCOUNTER — Encounter (HOSPITAL_COMMUNITY): Payer: Self-pay

## 2023-09-11 ENCOUNTER — Emergency Department (HOSPITAL_COMMUNITY)
Admission: EM | Admit: 2023-09-11 | Discharge: 2023-09-11 | Disposition: A | Discharge status: Home / Self Care | Payer: 59 | Attending: Emergency Medicine | Admitting: Emergency Medicine

## 2023-09-11 ENCOUNTER — Emergency Department (HOSPITAL_COMMUNITY): Payer: 59

## 2023-09-11 DIAGNOSIS — I159 Secondary hypertension, unspecified: Secondary | ICD-10-CM | POA: Diagnosis not present

## 2023-09-11 DIAGNOSIS — Z79899 Other long term (current) drug therapy: Secondary | ICD-10-CM | POA: Insufficient documentation

## 2023-09-11 DIAGNOSIS — E876 Hypokalemia: Secondary | ICD-10-CM | POA: Insufficient documentation

## 2023-09-11 DIAGNOSIS — Z7982 Long term (current) use of aspirin: Secondary | ICD-10-CM | POA: Insufficient documentation

## 2023-09-11 DIAGNOSIS — R55 Syncope and collapse: Secondary | ICD-10-CM | POA: Diagnosis present

## 2023-09-11 DIAGNOSIS — I10 Essential (primary) hypertension: Secondary | ICD-10-CM | POA: Insufficient documentation

## 2023-09-11 LAB — COMPREHENSIVE METABOLIC PANEL
ALT: 16 U/L (ref 0–44)
AST: 21 U/L (ref 15–41)
Albumin: 3.2 g/dL — ABNORMAL LOW (ref 3.5–5.0)
Alkaline Phosphatase: 46 U/L (ref 38–126)
Anion gap: 8 (ref 5–15)
BUN: 12 mg/dL (ref 6–20)
CO2: 29 mmol/L (ref 22–32)
Calcium: 8.7 mg/dL — ABNORMAL LOW (ref 8.9–10.3)
Chloride: 101 mmol/L (ref 98–111)
Creatinine, Ser: 1.19 mg/dL — ABNORMAL HIGH (ref 0.44–1.00)
GFR, Estimated: 56 mL/min — ABNORMAL LOW (ref >60)
Glucose, Bld: 95 mg/dL (ref 70–99)
Potassium: 3 mmol/L — ABNORMAL LOW (ref 3.5–5.1)
Sodium: 138 mmol/L (ref 135–145)
Total Bilirubin: 0.3 mg/dL (ref 0.3–1.2)
Total Protein: 6.6 g/dL (ref 6.5–8.1)

## 2023-09-11 LAB — TROPONIN I (HIGH SENSITIVITY)
Troponin I (High Sensitivity): 14 ng/L (ref <18)
Troponin I (High Sensitivity): 16 ng/L (ref <18)

## 2023-09-11 LAB — CBC
HCT: 33.2 % — ABNORMAL LOW (ref 36.0–46.0)
Hemoglobin: 9.7 g/dL — ABNORMAL LOW (ref 12.0–15.0)
MCH: 21.3 pg — ABNORMAL LOW (ref 26.0–34.0)
MCHC: 29.2 g/dL — ABNORMAL LOW (ref 30.0–36.0)
MCV: 72.8 fL — ABNORMAL LOW (ref 80.0–100.0)
Platelets: 409 10*3/uL — ABNORMAL HIGH (ref 150–400)
RBC: 4.56 MIL/uL (ref 3.87–5.11)
RDW: 16.3 % — ABNORMAL HIGH (ref 11.5–15.5)
WBC: 5.7 10*3/uL (ref 4.0–10.5)
nRBC: 0 % (ref 0.0–0.2)

## 2023-09-11 LAB — MAGNESIUM: Magnesium: 1.9 mg/dL (ref 1.7–2.4)

## 2023-09-11 MED ORDER — POTASSIUM CHLORIDE 20 MEQ PO PACK
20.0000 meq | PACK | Freq: Once | ORAL | Status: AC
  Administered 2023-09-11: 20 meq via ORAL
  Filled 2023-09-11: qty 1

## 2023-09-11 MED ORDER — METOPROLOL TARTRATE 5 MG/5ML IV SOLN
5.0000 mg | Freq: Once | INTRAVENOUS | Status: AC
  Administered 2023-09-11: 5 mg via INTRAVENOUS
  Filled 2023-09-11: qty 5

## 2023-09-11 MED ORDER — LISINOPRIL 20 MG PO TABS: 20.0000 mg | ORAL_TABLET | Freq: Every day | ORAL | 0 refills | Status: DC

## 2023-09-11 MED ORDER — LISINOPRIL 20 MG PO TABS
20.0000 mg | ORAL_TABLET | Freq: Once | ORAL | Status: AC
  Administered 2023-09-11: 20 mg via ORAL
  Filled 2023-09-11: qty 1

## 2023-09-11 MED ORDER — MAGNESIUM OXIDE -MG SUPPLEMENT 400 (240 MG) MG PO TABS
400.0000 mg | ORAL_TABLET | Freq: Once | ORAL | Status: AC
  Administered 2023-09-11: 400 mg via ORAL
  Filled 2023-09-11: qty 1

## 2023-09-11 MED ORDER — POTASSIUM CHLORIDE ER 10 MEQ PO TBCR: 10.0000 meq | EXTENDED_RELEASE_TABLET | Freq: Every day | ORAL | 0 refills | Status: DC

## 2023-09-11 MED ORDER — ASPIRIN 81 MG PO CHEW
324.0000 mg | CHEWABLE_TABLET | Freq: Once | ORAL | Status: AC
  Administered 2023-09-11: 324 mg via ORAL
  Filled 2023-09-11: qty 4

## 2023-09-11 MED ORDER — POTASSIUM CHLORIDE CRYS ER 20 MEQ PO TBCR
40.0000 meq | EXTENDED_RELEASE_TABLET | Freq: Once | ORAL | Status: AC
  Administered 2023-09-11: 40 meq via ORAL
  Filled 2023-09-11: qty 2

## 2023-09-11 MED ORDER — HYDROCHLOROTHIAZIDE 25 MG PO TABS: 25.0000 mg | ORAL_TABLET | Freq: Every day | ORAL | 0 refills | Status: AC

## 2023-09-11 NOTE — ED Provider Notes (Signed)
[place] EMERGENCY DEPARTMENT AT [facility] Provider Note   CSN: [id number] Arrival date & time: 09/11/23  1213     History  Chief Complaint  Patient presents with   Hypertension    Martha Thomas is a 47 y.o. female.  With a past medical history of hypertension who presents to the ED after syncopal episode.  Patient suffered a syncopal episode while in church earlier today.  She felt lightheaded and diaphoretic during the service and then fainted.  No associated trauma or injury.  She came to and was at her neurologic baseline.  EMS was called and noted the patient to be significantly hypertensive with a systolic blood pressure over 200.  Since the syncopal episode patient reports feeling well overall.  She does not have a headache, chest pain, shortness of breath, nausea or abdominal pain.  Patient is prescribed both lisinopril and hydrochlorothiazide for management of her hypertension but tells me she has not taken these medications since March because of a high co-pay for her PCP office visit.  She does have a family history of early onset cardiac events in both her father and sister.   Hypertension Pertinent negatives include no chest pain and no shortness of breath.       Home Medications Prior to Admission medications   Medication Sig Start Date End Date Taking? Authorizing Provider  hydrochlorothiazide (HYDRODIURIL) 25 MG tablet Take 1 tablet (25 mg total) by mouth daily. 09/11/23  Yes Estelle June A, DO  lisinopril (ZESTRIL) 20 MG tablet Take 1 tablet (20 mg total) by mouth daily. 09/11/23  Yes Estelle June A, DO  amLODipine (NORVASC) 10 MG tablet Take 10 mg by mouth daily.    [provider]  amLODipine (NORVASC) 5 MG tablet Take 1 tablet (5 mg total) by mouth daily. 01/28/22 02/27/22  Tanda Rockers, PA-C  amoxicillin (AMOXIL) 500 MG capsule Take 1 capsule (500 mg total) by mouth 3 (three) times daily. 07/14/23   Elson Areas, PA-C  aspirin EC 81 MG  tablet Take 81 mg by mouth daily.    [provider]  cyclobenzaprine (FLEXERIL) 10 MG tablet Take 1 tablet (10 mg total) by mouth 2 (two) times daily as needed for muscle spasms. 01/28/14   Piepenbrink, Victorino Dike, PA-C  hydrochlorothiazide (HYDRODIURIL) 25 MG tablet Take 25 mg by mouth daily.    [provider]  lisinopril-hydrochlorothiazide (ZESTORETIC) 20-25 MG tablet Take 1 tablet by mouth daily. 01/28/22 02/27/22  Hyman Hopes, Margaux, PA-C  potassium chloride (KLOR-CON) 10 MEQ tablet Take 1 tablet (10 mEq total) by mouth daily for 5 days. 01/28/22 02/02/22  Tanda Rockers, PA-C      Allergies    Patient has no known allergies.    Review of Systems   Review of Systems  Constitutional:  Positive for diaphoresis.  Respiratory:  Negative for shortness of breath.   Cardiovascular:  Negative for chest pain.  Neurological:  Positive for syncope and light-headedness.  All other systems reviewed and are negative.   Physical Exam Updated Vital Signs BP (!) 212/113   Pulse 85   Temp 98 F (36.7 C) (Oral)   Resp 16   Ht 5\' 3"  (1.6 m)   SpO2 100%   BMI 40.39 kg/m  Physical Exam Vitals and nursing note reviewed.  HENT:     Head: Normocephalic and atraumatic.  Eyes:     Pupils: Pupils are equal, round, and reactive to light.  Cardiovascular:     Rate and Rhythm:  Normal rate and regular rhythm.  Pulmonary:     Effort: Pulmonary effort is normal.     Breath sounds: Normal breath sounds.  Abdominal:     Palpations: Abdomen is soft.     Tenderness: There is no abdominal tenderness.  Skin:    General: Skin is warm and dry.  Neurological:     General: No focal deficit present.     Mental Status: She is alert and oriented to person, place, and time.  Psychiatric:        Mood and Affect: Mood normal.     ED Results / Procedures / Treatments   Labs (all labs ordered are listed, but only abnormal results are displayed) Labs Reviewed  CBC - Abnormal; Notable for the  following components:      Result Value   Hemoglobin 9.7 (*)    HCT 33.2 (*)    MCV 72.8 (*)    MCH 21.3 (*)    MCHC 29.2 (*)    RDW 16.3 (*)    Platelets 409 (*)    All other components within normal limits  COMPREHENSIVE METABOLIC PANEL - Abnormal; Notable for the following components:   Potassium 3.0 (*)    Creatinine, Ser 1.19 (*)    Calcium 8.7 (*)    Albumin 3.2 (*)    GFR, Estimated 56 (*)    All other components within normal limits  MAGNESIUM  CBG MONITORING, ED  TROPONIN I (HIGH SENSITIVITY)  TROPONIN I (HIGH SENSITIVITY)    EKG EKG Interpretation Date/Time:  Sunday September 11 2023 12:20:50 EDT Ventricular Rate:  89 PR Interval:  178 QRS Duration:  89 QT Interval:  429 QTC Calculation: 522 R Axis:   44  Text Interpretation: Sinus rhythm Probable left atrial enlargement LVH with secondary repolarization abnormality Prolonged QT interval Confirmed by Estelle June [phone]) on 09/11/2023 3:01:01 PM  Radiology DG Chest Portable 1 View  Result Date: 09/11/2023 CLINICAL DATA:  Hypertension.  Dizziness and lightheadedness. EXAM: PORTABLE CHEST 1 VIEW COMPARISON:  None Available. FINDINGS: The heart size and mediastinal contours are within normal limits. Both lungs are clear. The visualized skeletal structures are unremarkable. IMPRESSION: No active disease. Electronically Signed   By: Signa Kell M.D.   On: 09/11/2023 13:24    Procedures Procedures    Medications Ordered in ED Medications  lisinopril (ZESTRIL) tablet 20 mg (has no administration in time range)  potassium chloride SA (KLOR-CON M) CR tablet 40 mEq (has no administration in time range)  aspirin chewable tablet 324 mg (324 mg Oral Given 09/11/23 1307)  potassium chloride (KLOR-CON) packet 20 mEq (20 mEq Oral Given 09/11/23 1540)  magnesium oxide (MAG-OX) tablet 400 mg (400 mg Oral Given 09/11/23 1540)  metoprolol tartrate (LOPRESSOR) injection 5 mg (5 mg Intravenous Given 09/11/23 1558)    ED  Course/ Medical Decision Making/ A&P Clinical Course as of 09/11/23 1615  Sun Sep 11, 2023  1504 Laboratory workup notable for hypokalemia of 3.0 with hypomagnesemia of 1.9.  Will replete potassium and magnesium here.  CBC looks like microcytic anemia most likely iron deficiency.  Initial troponin 14 will obtain delta troponin here.  EKG shows no evidence of dysrhythmia or ischemic changes.  Chest x-ray NAD. [MP]  1557 Patient has remained hypertensive here.  Will provide 5 mg labetalol for acute pressure control. [MP]  1610 I, Estelle June DO, am transitioning care of this patient to the oncoming provider pending delta troponin, reassessment and disposition [MP]    Clinical  Course User Index [MP] Royanne Foots, DO                                 Medical Decision Making 47 year old female with history of hypertension presenting after syncopal episode.  Episode of syncope while in church preceded by diaphoresis and lightheadedness.  Profoundly hypertensive upon EMS arrival but has no systemic complaints at this time.  Differential diagnosis of syncopal episode include atypical ACS, dysrhythmia, vasovagal episode and dehydration.  Will obtain laboratory workup including EKG, high-sensitivity troponin, chest x-ray and labs including CBC, CMP magnesium.  Will give 324 mg oral aspirin given potential atypical ACS and continue to monitor in the ED on telemetry  Amount and/or Complexity of Data Reviewed Labs: ordered. Radiology: ordered.  Risk OTC drugs. Prescription drug management.           Final Clinical Impression(s) / ED Diagnoses Final diagnoses:  Secondary hypertension  Syncope and collapse    Rx / DC Orders ED Discharge Orders          Ordered    lisinopril (ZESTRIL) 20 MG tablet  Daily        09/11/23 1614    hydrochlorothiazide (HYDRODIURIL) 25 MG tablet  Daily        09/11/23 1614              Royanne Foots, DO 09/11/23 1615

## 2023-09-11 NOTE — ED Triage Notes (Signed)
Pt BIB EMS from church after getting dizzy/lightheaded. No LOC. Denies chest pain, SOB. Initial pressure with EMS 260/130. Hx of hypertension, noncompliant with meds, can't afford. Aox4.

## 2023-09-11 NOTE — ED Provider Notes (Signed)
  Physical Exam  BP (!) 181/116   Pulse 74   Temp 97.8 F (36.6 C) (Oral)   Resp 16   Ht 5\' 3"  (1.6 m)   SpO2 100%   BMI 40.39 kg/m   Physical Exam  Procedures  Procedures  ED Course / MDM   Clinical Course as of 09/11/23 1651  Sun Sep 11, 2023  1504 Laboratory workup notable for hypokalemia of 3.0 with hypomagnesemia of 1.9.  Will replete potassium and magnesium here.  CBC looks like microcytic anemia most likely iron deficiency.  Initial troponin 14 will obtain delta troponin here.  EKG shows no evidence of dysrhythmia or ischemic changes.  Chest x-ray NAD. [MP]  1557 Patient has remained hypertensive here.  Will provide 5 mg labetalol for acute pressure control. [MP]  1610 I, Estelle June DO, am transitioning care of this patient to the oncoming provider pending delta troponin, reassessment and disposition [MP]    Clinical Course User Index [MP] Royanne Foots, DO   Medical Decision Making Amount and/or Complexity of Data Reviewed Labs: ordered. Radiology: ordered.  Risk OTC drugs. Prescription drug management.   Wardell Honour, assumed care of this patient.  In brief this is a 47 year old female presents emergency department after syncopal episode while she was at church.  Patient says that she had a feel warm She knew she was on the ground.  She has never had an episode like this previously.  I reviewed the patient's EKG, she does have some repolarization in her anterior precordial leads, however this is unchanged from prior EKG in our system.  Initial troponin was 14, patient was a signout pending repeat troponin.  Second was 16.  Patient hypertensive, she tells me that she has been unable to afford the co-pay for her doctors office which is why she has not been taking her medications.  Previous doctor sent prescriptions.  Patient's potassium mildly decreased.  Gave some oral potassium.  Will send her prescription for additional potassium.  Patient ambulatory  without any difficulty.  Sounds more like a vagal syncopal episode.  Will discharge patient.       Anders Simmonds T, DO 09/11/23 1656

## 2023-09-11 NOTE — Discharge Instructions (Signed)
You were seen in the emergency department after an episode of fainting We are not certain why you fainted today but your EKG and blood work all looked okay Your blood pressure was very high here today most likely because you have not been taking the blood pressure medications which were prescribed to you It is very important that you resume these medications as persistently high blood pressure can lead to serious events such as heart attack and stroke or even death We have called in prescriptions for your lisinopril and hydrochlorothiazide to your pharmacy Begin taking his medications as directed and follow-up with your primary care doctor within the next 1 to 2 weeks Return to the emergency department for repeated episodes of fainting, chest pain, headaches or any other concerns

## 2023-10-02 IMAGING — DX DG KNEE COMPLETE 4+V*R*
4 series · 4 of 4 positions shown · non-contrast
Comparison: None.

CLINICAL DATA: Intermittent knee pain.

EXAM:
RIGHT KNEE - COMPLETE 4+ VIEW

[knee ap]
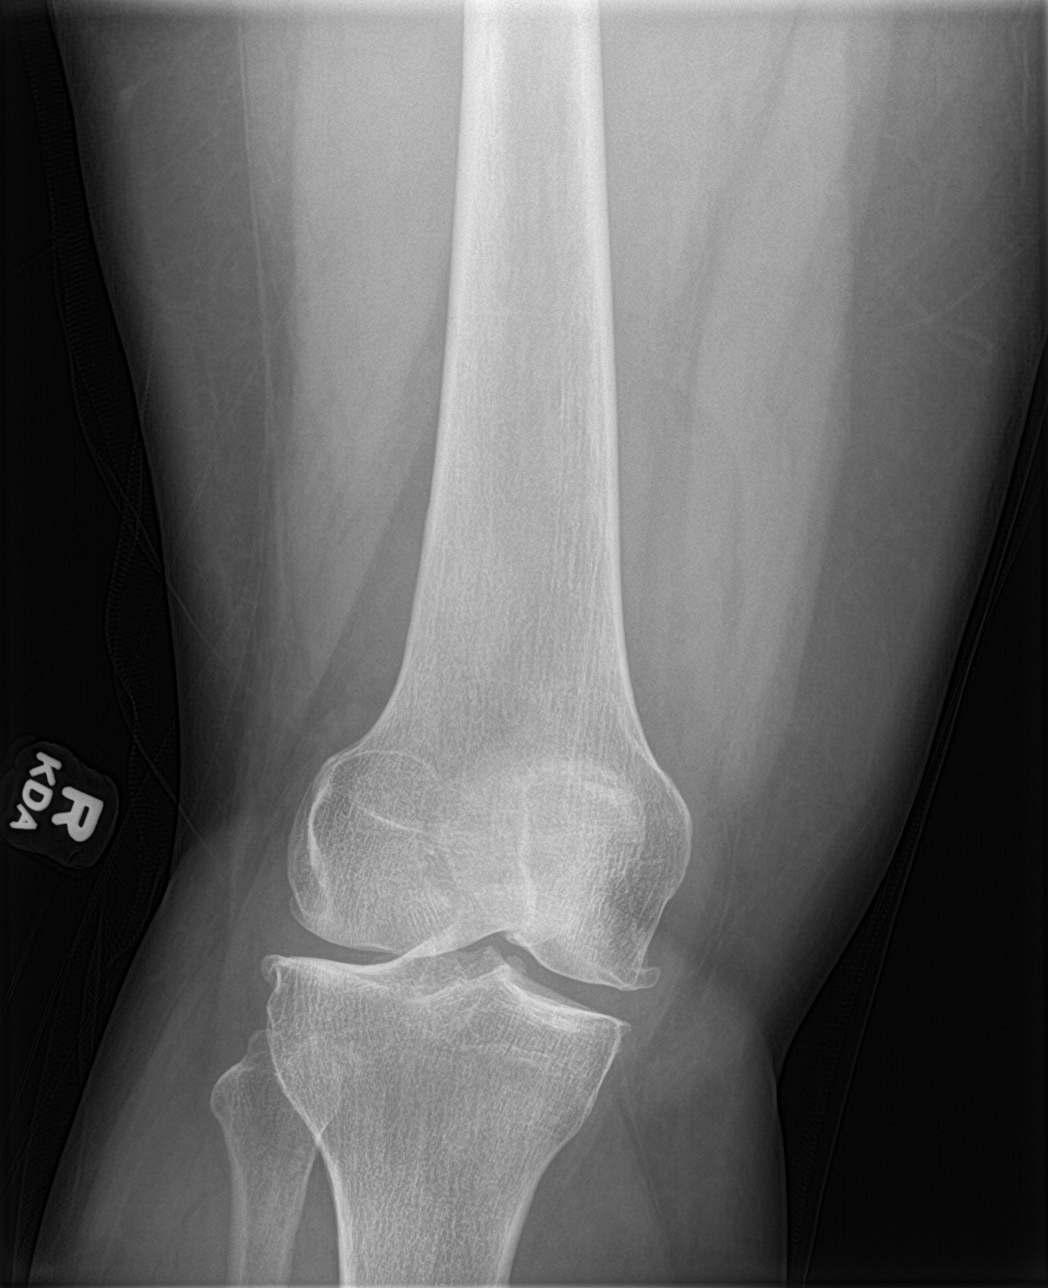

[knee lat]
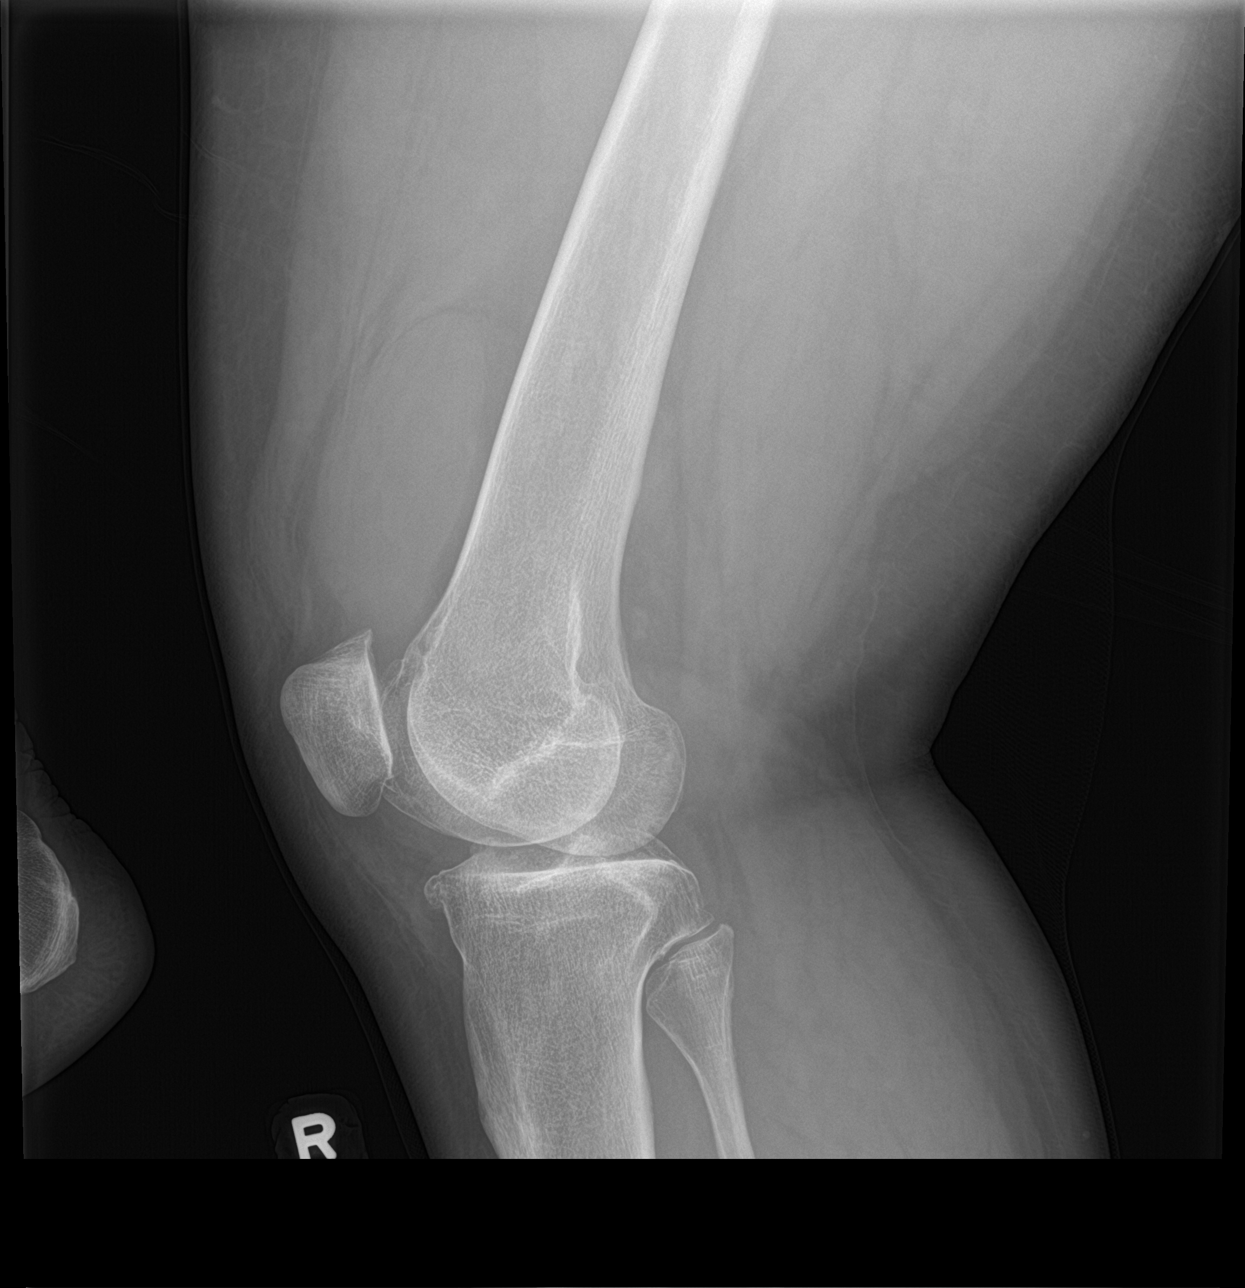

[knee obl (1 of 2)]
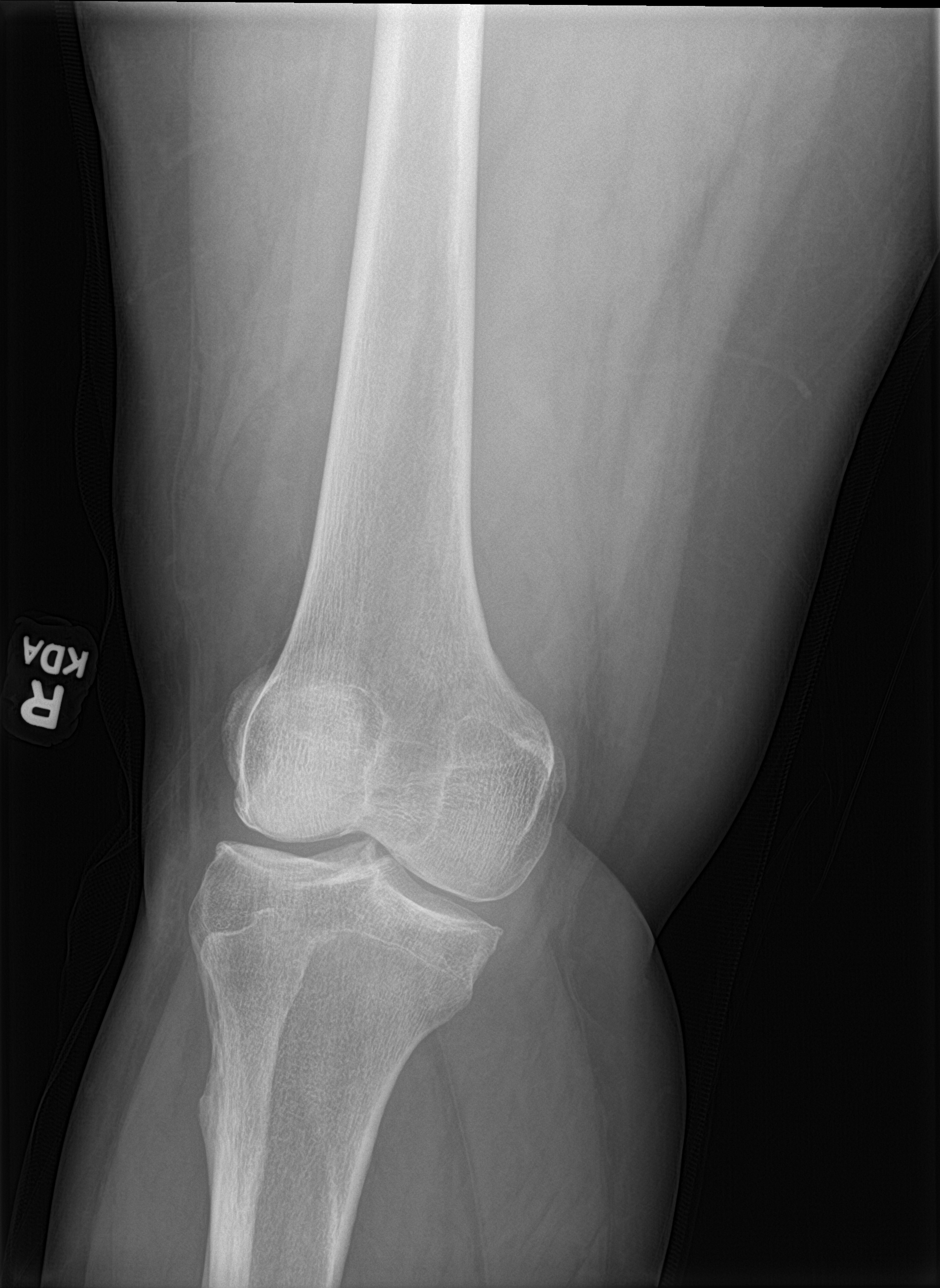

[knee obl (2 of 2)]
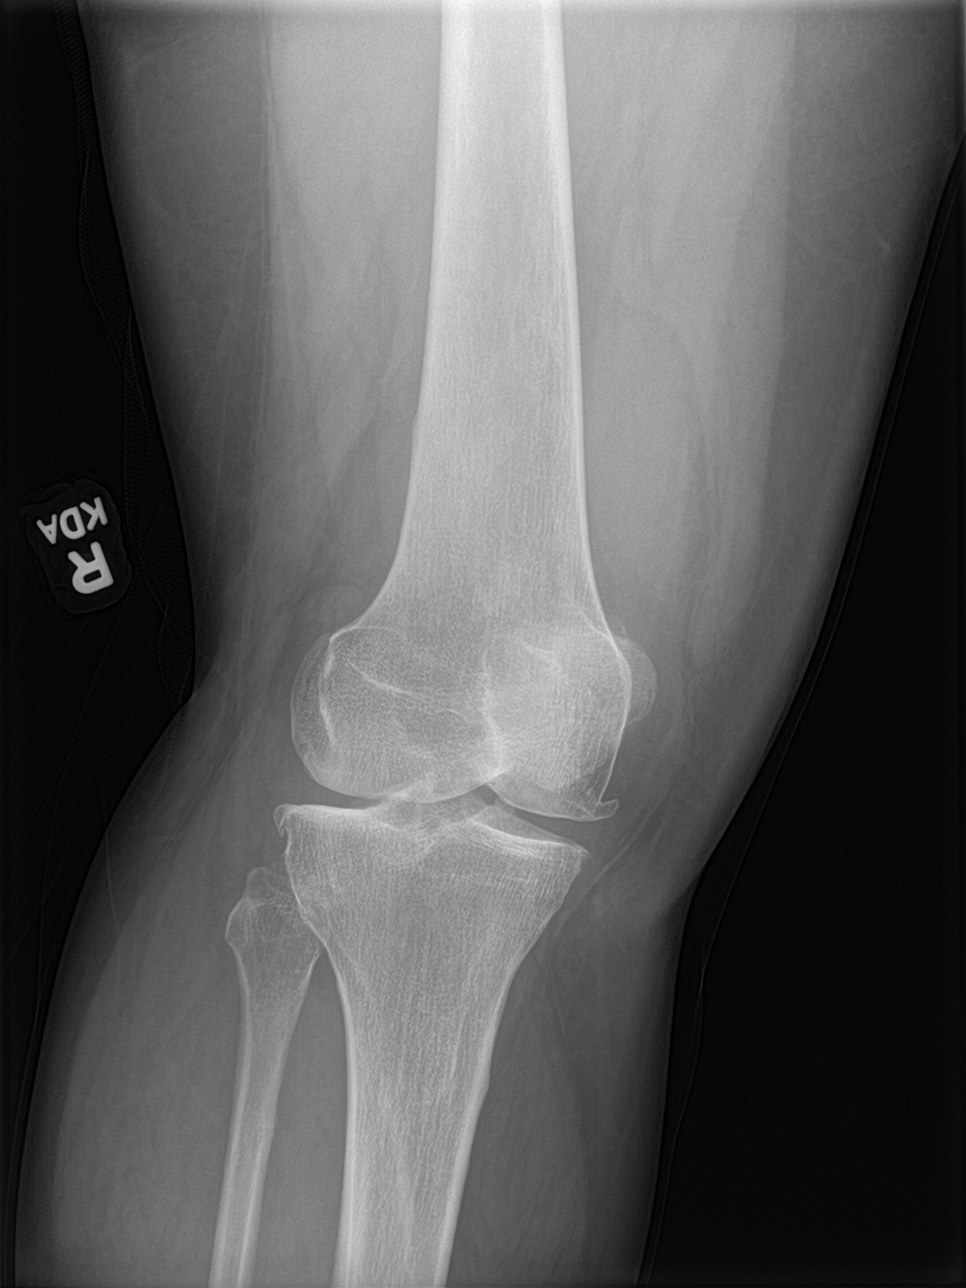

[4 of 4 positions shown; findings below may reference images not displayed]

FINDINGS: No evidence of fracture or dislocation. Moderate suprapatellar joint
effusion. Mild tricompartmental osteoarthritis. No focal osseous
lesion.
IMPRESSION: 1. No acute osseous abnormality.
2. Moderate suprapatellar joint effusion.
3. Mild tricompartmental osteoarthritis.

## 2024-07-03 ENCOUNTER — Ambulatory Visit: Payer: Self-pay | Admitting: Orthopedic Surgery

## 2024-07-05 NOTE — Patient Instructions (Signed)
 SURGICAL WAITING ROOM VISITATION  Patients having surgery or a procedure may have no more than 2 support people in the waiting area - these visitors may rotate.    Children under the age of 79 must have an adult with them who is not the patient.  Visitors with respiratory illnesses are discouraged from visiting and should remain at home.  If the patient needs to stay at the hospital during part of their recovery, the visitor guidelines for inpatient rooms apply. Pre-op nurse will coordinate an appropriate time for 1 support person to accompany patient in pre-op.  This support person may not rotate.    Please refer to the [facility] website for the visitor guidelines for Inpatients (after your surgery is over and you are in a regular room).       Your procedure is scheduled on: 07/11/24   Report to [facility] Main Entrance    Report to admitting at: 9:00 AM   Call this number if you have problems the morning of surgery [phone]   Do not eat food :After Midnight.   After Midnight you may have the following liquids until : 8:30 AM DAY OF SURGERY  Water Non-Citrus Juices (without pulp, NO RED-Apple, White grape, White cranberry) Black Coffee (NO MILK/CREAM OR CREAMERS, sugar ok)  Clear Tea (NO MILK/CREAM OR CREAMERS, sugar ok) regular and decaf                             Plain Jell-O (NO RED)                                           Fruit ices (not with fruit pulp, NO RED)                                     Popsicles (NO RED)                                                               Sports drinks like Gatorade (NO RED)   The day of surgery:  Drink ONE (1) Pre-Surgery Clear Ensure at : 8:30 AM the morning of surgery. Drink in one sitting. Do not sip.  This drink was given to you during your hospital  pre-op appointment visit. Nothing else to drink after completing the  Pre-Surgery Clear Ensure or G2.          If you have questions, please contact your  surgeon's office.   FOLLOW ANY ADDITIONAL PRE OP INSTRUCTIONS YOU RECEIVED FROM YOUR SURGEON'S OFFICE!!!  Oral Hygiene is also important to reduce your risk of infection.                                    Remember - BRUSH YOUR TEETH THE MORNING OF SURGERY WITH YOUR REGULAR TOOTHPASTE  DENTURES WILL BE REMOVED PRIOR TO SURGERY PLEASE DO NOT APPLY Poly grip OR ADHESIVES!!!   Do NOT smoke after Midnight   Stop all vitamins  and herbal supplements 7 days before surgery.   Take these medicines the morning of surgery with A SIP OF WATER: amlodipine                               You may not have any metal on your body including hair pins, jewelry, and body piercing             Do not wear make-up, lotions, powders, perfumes/cologne, or deodorant  Do not wear nail polish including gel and S&S, artificial/acrylic nails, or any other type of covering on natural nails including finger and toenails. If you have artificial nails, gel coating, etc. that needs to be removed by a nail salon please have this removed prior to surgery or surgery may need to be canceled/ delayed if the surgeon/ anesthesia feels like they are unable to be safely monitored.   Do not shave  48 hours prior to surgery.    Do not bring valuables to the hospital. [facility] IS NOT             RESPONSIBLE   FOR VALUABLES.   Contacts, glasses, dentures or bridgework may not be worn into surgery.   Bring small overnight bag day of surgery.   DO NOT BRING YOUR HOME MEDICATIONS TO THE HOSPITAL. PHARMACY WILL DISPENSE MEDICATIONS LISTED ON YOUR MEDICATION LIST TO YOU DURING YOUR ADMISSION IN THE HOSPITAL!    Patients discharged on the day of surgery will not be allowed to drive home.  Someone NEEDS to stay with you for the first 24 hours after anesthesia.   Special Instructions: Bring a copy of your healthcare power of attorney and living will documents the day of surgery if you haven't scanned them before.               Please read over the following fact sheets you were given: IF YOU HAVE QUESTIONS ABOUT YOUR PRE-OP INSTRUCTIONS PLEASE CALL [phone]   If you received a COVID test during your pre-op visit  it is requested that you wear a mask when out in public, stay away from anyone that may not be feeling well and notify your surgeon if you develop symptoms. If you test positive for Covid or have been in contact with anyone that has tested positive in the last 10 days please notify you surgeon.      Pre-operative 5 CHG Bath Instructions   You can play a key role in reducing the risk of infection after surgery. Your skin needs to be as free of germs as possible. You can reduce the number of germs on your skin by washing with CHG (chlorhexidine gluconate) soap before surgery. CHG is an antiseptic soap that kills germs and continues to kill germs even after washing.   DO NOT use if you have an allergy to chlorhexidine/CHG or antibacterial soaps. If your skin becomes reddened or irritated, stop using the CHG and notify one of our RNs at [phone].   Please shower with the CHG soap starting 4 days before surgery using the following schedule:     Please keep in mind the following:  DO NOT shave, including legs and underarms, starting the day of your first shower.   You may shave your face at any point before/day of surgery.  Place clean sheets on your bed the day you start using CHG soap. Use a clean washcloth (not used since being washed) for each shower. DO  NOT sleep with pets once you start using the CHG.   CHG Shower Instructions:  If you choose to wash your hair and private area, wash first with your normal shampoo/soap.  After you use shampoo/soap, rinse your hair and body thoroughly to remove shampoo/soap residue.  Turn the water OFF and apply about 3 tablespoons (45 ml) of CHG soap to a CLEAN washcloth.  Apply CHG soap ONLY FROM YOUR NECK DOWN TO YOUR TOES (washing for 3-5 minutes)  DO NOT  use CHG soap on face, private areas, open wounds, or sores.  Pay special attention to the area where your surgery is being performed.  If you are having back surgery, having someone wash your back for you may be helpful. Wait 2 minutes after CHG soap is applied, then you may rinse off the CHG soap.  Pat dry with a clean towel  Put on clean clothes/pajamas   If you choose to wear lotion, please use ONLY the CHG-compatible lotions on the back of this paper.     Additional instructions for the day of surgery: DO NOT APPLY any lotions, deodorants, cologne, or perfumes.   Put on clean/comfortable clothes.  Brush your teeth.  Ask your nurse before applying any prescription medications to the skin.   CHG Compatible Lotions   Aveeno Moisturizing lotion  Cetaphil Moisturizing Cream  Cetaphil Moisturizing Lotion  Clairol Herbal Essence Moisturizing Lotion, Dry Skin  Clairol Herbal Essence Moisturizing Lotion, Extra Dry Skin  Clairol Herbal Essence Moisturizing Lotion, Normal Skin  Curel Age Defying Therapeutic Moisturizing Lotion with Alpha Hydroxy  Curel Extreme Care Body Lotion  Curel Soothing Hands Moisturizing Hand Lotion  Curel Therapeutic Moisturizing Cream, Fragrance-Free  Curel Therapeutic Moisturizing Lotion, Fragrance-Free  Curel Therapeutic Moisturizing Lotion, Original Formula  Eucerin Daily Replenishing Lotion  Eucerin Dry Skin Therapy Plus Alpha Hydroxy Crme  Eucerin Dry Skin Therapy Plus Alpha Hydroxy Lotion  Eucerin Original Crme  Eucerin Original Lotion  Eucerin Plus Crme Eucerin Plus Lotion  Eucerin TriLipid Replenishing Lotion  Keri Anti-Bacterial Hand Lotion  Keri Deep Conditioning Original Lotion Dry Skin Formula Softly Scented  Keri Deep Conditioning Original Lotion, Fragrance Free Sensitive Skin Formula  Keri Lotion Fast Absorbing Fragrance Free Sensitive Skin Formula  Keri Lotion Fast Absorbing Softly Scented Dry Skin Formula  Keri Original Lotion  Keri Skin  Renewal Lotion Keri Silky Smooth Lotion  Keri Silky Smooth Sensitive Skin Lotion  Nivea Body Creamy Conditioning Oil  Nivea Body Extra Enriched Lotion  Nivea Body Original Lotion  Nivea Body Sheer Moisturizing Lotion Nivea Crme  Nivea Skin Firming Lotion  NutraDerm 30 Skin Lotion  NutraDerm Skin Lotion  NutraDerm Therapeutic Skin Cream  NutraDerm Therapeutic Skin Lotion  ProShield Protective Hand Cream  Provon moisturizing lotion   Incentive Spirometer  An incentive spirometer is a tool that can help keep your lungs clear and active. This tool measures how well you are filling your lungs with each breath. Taking long deep breaths may help reverse or decrease the chance of developing breathing (pulmonary) problems (especially infection) following: A long period of time when you are unable to move or be active. BEFORE THE PROCEDURE  If the spirometer includes an indicator to show your best effort, your nurse or respiratory therapist will set it to a desired goal. If possible, sit up straight or lean slightly forward. Try not to slouch. Hold the incentive spirometer in an upright position. INSTRUCTIONS FOR USE  Sit on the edge of your bed if possible, or sit  up as far as you can in bed or on a chair. Hold the incentive spirometer in an upright position. Breathe out normally. Place the mouthpiece in your mouth and seal your lips tightly around it. Breathe in slowly and as deeply as possible, raising the piston or the ball toward the top of the column. Hold your breath for 3-5 seconds or for as long as possible. Allow the piston or ball to fall to the bottom of the column. Remove the mouthpiece from your mouth and breathe out normally. Rest for a few seconds and repeat Steps 1 through 7 at least 10 times every 1-2 hours when you are awake. Take your time and take a few normal breaths between deep breaths. The spirometer may include an indicator to show your best effort. Use the indicator  as a goal to work toward during each repetition. After each set of 10 deep breaths, practice coughing to be sure your lungs are clear. If you have an incision (the cut made at the time of surgery), support your incision when coughing by placing a pillow or rolled up towels firmly against it. Once you are able to get out of bed, walk around indoors and cough well. You may stop using the incentive spirometer when instructed by your caregiver.  RISKS AND COMPLICATIONS Take your time so you do not get dizzy or light-headed. If you are in pain, you may need to take or ask for pain medication before doing incentive spirometry. It is harder to take a deep breath if you are having pain. AFTER USE Rest and breathe slowly and easily. It can be helpful to keep track of a log of your progress. Your caregiver can provide you with a simple table to help with this. If you are using the spirometer at home, follow these instructions: SEEK MEDICAL CARE IF:  You are having difficultly using the spirometer. You have trouble using the spirometer as often as instructed. Your pain medication is not giving enough relief while using the spirometer. You develop fever of 100.5 F (38.1 C) or higher. SEEK IMMEDIATE MEDICAL CARE IF:  You cough up bloody sputum that had not been present before. You develop fever of 102 F (38.9 C) or greater. You develop worsening pain at or near the incision site. MAKE SURE YOU:  Understand these instructions. Will watch your condition. Will get help right away if you are not doing well or get worse. Document Released: 04/25/2007 Document Revised: 03/06/2012 Document Reviewed: 06/26/2007 [facility] Patient Information 2014 [place], [place].   ________________________________________________________________________

## 2024-07-06 ENCOUNTER — Encounter (HOSPITAL_COMMUNITY): Payer: Self-pay

## 2024-07-06 ENCOUNTER — Other Ambulatory Visit: Payer: Self-pay

## 2024-07-06 ENCOUNTER — Encounter (HOSPITAL_COMMUNITY)
Admission: RE | Admit: 2024-07-06 | Discharge: 2024-07-06 | Disposition: A | Discharge status: Home / Self Care | Payer: Worker's Compensation | Source: Ambulatory Visit | Attending: Specialist | Admitting: Specialist

## 2024-07-06 VITALS — BP 148/95 | HR 85 | Temp 98.5°F | Ht 63.0 in | Wt 225.0 lb

## 2024-07-06 DIAGNOSIS — I10 Essential (primary) hypertension: Secondary | ICD-10-CM | POA: Diagnosis not present

## 2024-07-06 DIAGNOSIS — Z01812 Encounter for preprocedural laboratory examination: Secondary | ICD-10-CM | POA: Insufficient documentation

## 2024-07-06 DIAGNOSIS — Z01818 Encounter for other preprocedural examination: Secondary | ICD-10-CM

## 2024-07-06 HISTORY — DX: Unspecified osteoarthritis, unspecified site: M19.90

## 2024-07-06 LAB — SURGICAL PCR SCREEN
MRSA, PCR: NEGATIVE
Staphylococcus aureus: NEGATIVE

## 2024-07-06 LAB — CBC
HCT: 36.9 % (ref 36.0–46.0)
Hemoglobin: 11 g/dL — ABNORMAL LOW (ref 12.0–15.0)
MCH: 23.1 pg — ABNORMAL LOW (ref 26.0–34.0)
MCHC: 29.8 g/dL — ABNORMAL LOW (ref 30.0–36.0)
MCV: 77.5 fL — ABNORMAL LOW (ref 80.0–100.0)
Platelets: 303 K/uL (ref 150–400)
RBC: 4.76 MIL/uL (ref 3.87–5.11)
RDW: 15.9 % — ABNORMAL HIGH (ref 11.5–15.5)
WBC: 4.4 K/uL (ref 4.0–10.5)
nRBC: 0 % (ref 0.0–0.2)

## 2024-07-06 LAB — BASIC METABOLIC PANEL WITH GFR
Anion gap: 10 (ref 5–15)
BUN: 16 mg/dL (ref 6–20)
CO2: 31 mmol/L (ref 22–32)
Calcium: 9.3 mg/dL (ref 8.9–10.3)
Chloride: 98 mmol/L (ref 98–111)
Creatinine, Ser: 1.23 mg/dL — ABNORMAL HIGH (ref 0.44–1.00)
GFR, Estimated: 54 mL/min — ABNORMAL LOW (ref >60)
Glucose, Bld: 121 mg/dL — ABNORMAL HIGH (ref 70–99)
Potassium: 3.3 mmol/L — ABNORMAL LOW (ref 3.5–5.1)
Sodium: 139 mmol/L (ref 135–145)

## 2024-07-06 NOTE — Progress Notes (Addendum)
 For Anesthesia: PCP - Rosalea Rosina SAILOR, PA  Cardiologist - N/A  Bowel Prep reminder:  Chest x-ray - 09/11/23 EKG - 09/11/23 Stress Test -  ECHO -  Cardiac Cath -  Pacemaker/ICD device last checked: Pacemaker orders received: Device Rep notified:  Spinal Cord Stimulator:N/A  Sleep Study - Yes CPAP - NO  Fasting Blood Sugar - N/A Checks Blood Sugar _____ times a day Date and result of last Hgb A1c-  Last dose of GLP1 agonist- N/A GLP1 instructions:   Last dose of SGLT-2 inhibitors- N/A SGLT-2 instructions:   Blood Thinner Instructions:N/A Aspirin  Instructions: Last Dose:  Activity level: Can go up a flight of stairs and activities of daily living without stopping and without chest pain and/or shortness of breath   Able to exercise without chest pain and/or shortness of breath  Anesthesia review: Hx: HTN  Patient denies shortness of breath, fever, cough and chest pain at PAT appointment   Patient verbalized understanding of instructions that were reviewed over the telephone.

## 2024-07-09 ENCOUNTER — Ambulatory Visit: Payer: Self-pay | Admitting: Orthopedic Surgery

## 2024-07-09 NOTE — H&P (View-Only) (Signed)
 Martha Thomas is an 48 y.o. female.   Chief Complaint: left knee pain HPI: Patient is here for her H&P. Patient is scheduled for a LTKA by Dr. Duwayne on 07/11/24 at [facility].  Dr. Duwayne and the patient mutually agreed to proceed with a total knee replacement. Risks and benefits of the procedure were discussed including stiffness, suboptimal range of motion, persistent pain, infection requiring removal of prosthesis and reinsertion, need for prophylactic antibiotics in the future, for example, dental procedures, possible need for manipulation, revision in the future and also anesthetic complications including DVT, PE, etc. We discussed the perioperative course, time in the hospital, postoperative recovery and the need for elevation to control swelling. We also discussed the predicted range of motion and the probability that squatting and kneeling would be unobtainable in the future. In addition, postoperative anticoagulation was discussed. We have obtained preoperative medical clearance as necessary. Provided illustrated handout and discussed it in detail. They will enroll in the total joint replacement educational forum at the hospital.  She was previously on iron but her PCP took her off of it. She was measured at weight at her preop appointment last week and BMI was under 40.  Past Medical History:  Diagnosis Date   Arthritis    Hypertension     Past Surgical History:  Procedure Laterality Date   TUBAL LIGATION      Family History  Problem Relation Age of Onset   Hypertension Mother    Diabetes Mother    Heart failure Father    Social History:  reports that she has quit smoking. She does not have any smokeless tobacco history on file. She reports that she does not currently use alcohol after a past usage of about 2.0 standard drinks of alcohol per week. She reports that she does not use drugs.  Allergies: No Known Allergies  Current meds: amLODIPine  10 mg-benazepriL  40 mg  capsule chlorhexidine  gluconate 4 % topical liquid hydroCHLOROthiazide  25 mg tablet methocarbamoL  500 mg tablet  Review of Systems  Constitutional: Negative.   HENT: Negative.    Eyes: Negative.   Respiratory: Negative.    Cardiovascular: Negative.   Gastrointestinal: Negative.   Endocrine: Negative.   Genitourinary: Negative.   Musculoskeletal:  Positive for arthralgias, gait problem and joint swelling.  Skin: Negative.   Psychiatric/Behavioral: Negative.      There were no vitals taken for this visit. Physical Exam Constitutional:      Appearance: Normal appearance.  HENT:     Head: Normocephalic and atraumatic.     Right Ear: External ear normal.     Left Ear: External ear normal.     Nose: Nose normal.     Mouth/Throat:     Pharynx: Oropharynx is clear.  Eyes:     Conjunctiva/sclera: Conjunctivae normal.  Cardiovascular:     Rate and Rhythm: Normal rate and regular rhythm.     Pulses: Normal pulses.  Pulmonary:     Effort: Pulmonary effort is normal.  Abdominal:     General: Bowel sounds are normal.  Musculoskeletal:     Cervical back: Normal range of motion.     Comments: The knee tender medial joint line patellofemoral pain compression ranges -5-1 10. She walks with an antalgic gait. Externally rotating. Slightly dynamic pes planus  Skin:    General: Skin is warm and dry.  Neurological:     Mental Status: She is alert.    X-rays of her left knee demonstrate end-stage osteoarthrosis medial  compartment with a varus deformity. There are calcifications in the lateral femoral condyle consistent with an enchondroma. 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 Free edge tear of the medial meniscus tricompartmental osteoarthrosis full-thickness cartilage loss the medial compartment large joint effusion and enchondroma of the lateral femoral condyle measuring 2.7 x 2 cm.  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 Duwayne on July 16. We again discussed the procedure itself as well as risks, complications and alternatives, including but not limited to DVT, PE, infx, bleeding, failure of procedure, need for secondary procedure including manipulation, nerve injury, ongoing pain/symptoms, anesthesia risk, even stroke or death. Also discussed typical post-op protocols, activity restrictions, need for PT, flexion/extension exercises, time out of work. Discussed need for DVT ppx post-op per protocol. Discussed dental ppx and infx prevention. Also discussed limitations post-operatively such as kneeling and squatting. All questions were answered. Patient desires to proceed with surgery as scheduled.  Will hold supplements, ASA and NSAIDs accordingly. Will remain NPO after midnight the night before surgery. She has already completed her preop testing at [facility]. Anticipate hospital stay to include at least 2 midnights given medical history and to ensure proper pain control. Plan aspirin  for DVT ppx post-op. Plan oxycodone , Robaxin , Colace, Miralax . Plan home with HHPT post-op with family members at home for assistance. Will follow up 10-14 days post-op for suture removal and xrays. We did also discuss her anemia noted on preop labs, she is to resume iron in addition to vitamin C, typically iron causes her a lot of constipation so I told her she can hold off until postoperative to resume that if surgery is only 2 days away. We will have her PCP follow-up on labs accordingly.  Will submit for approval for HHPT, walker, and 3 in 1 commode through WC.  Plan Left total knee replacement  Myrian Botello M Synai Prettyman, PA-C for Dr. Duwayne 07/09/2024, 4:10 PM

## 2024-07-09 NOTE — H&P (Signed)
 Martha Thomas is an 48 y.o. female.   Chief Complaint: left knee pain HPI: Patient is here for her H&P. Patient is scheduled for a LTKA by Dr. Duwayne on 07/11/24 at [facility].  Dr. Duwayne and the patient mutually agreed to proceed with a total knee replacement. Risks and benefits of the procedure were discussed including stiffness, suboptimal range of motion, persistent pain, infection requiring removal of prosthesis and reinsertion, need for prophylactic antibiotics in the future, for example, dental procedures, possible need for manipulation, revision in the future and also anesthetic complications including DVT, PE, etc. We discussed the perioperative course, time in the hospital, postoperative recovery and the need for elevation to control swelling. We also discussed the predicted range of motion and the probability that squatting and kneeling would be unobtainable in the future. In addition, postoperative anticoagulation was discussed. We have obtained preoperative medical clearance as necessary. Provided illustrated handout and discussed it in detail. They will enroll in the total joint replacement educational forum at the hospital.  She was previously on iron but her PCP took her off of it. She was measured at weight at her preop appointment last week and BMI was under 40.  Past Medical History:  Diagnosis Date   Arthritis    Hypertension     Past Surgical History:  Procedure Laterality Date   TUBAL LIGATION      Family History  Problem Relation Age of Onset   Hypertension Mother    Diabetes Mother    Heart failure Father    Social History:  reports that she has quit smoking. She does not have any smokeless tobacco history on file. She reports that she does not currently use alcohol after a past usage of about 2.0 standard drinks of alcohol per week. She reports that she does not use drugs.  Allergies: No Known Allergies  Current meds: amLODIPine  10 mg-benazepriL  40 mg  capsule chlorhexidine  gluconate 4 % topical liquid hydroCHLOROthiazide  25 mg tablet methocarbamoL  500 mg tablet  Review of Systems  Constitutional: Negative.   HENT: Negative.    Eyes: Negative.   Respiratory: Negative.    Cardiovascular: Negative.   Gastrointestinal: Negative.   Endocrine: Negative.   Genitourinary: Negative.   Musculoskeletal:  Positive for arthralgias, gait problem and joint swelling.  Skin: Negative.   Psychiatric/Behavioral: Negative.      There were no vitals taken for this visit. Physical Exam Constitutional:      Appearance: Normal appearance.  HENT:     Head: Normocephalic and atraumatic.     Right Ear: External ear normal.     Left Ear: External ear normal.     Nose: Nose normal.     Mouth/Throat:     Pharynx: Oropharynx is clear.  Eyes:     Conjunctiva/sclera: Conjunctivae normal.  Cardiovascular:     Rate and Rhythm: Normal rate and regular rhythm.     Pulses: Normal pulses.  Pulmonary:     Effort: Pulmonary effort is normal.  Abdominal:     General: Bowel sounds are normal.  Musculoskeletal:     Cervical back: Normal range of motion.     Comments: The knee tender medial joint line patellofemoral pain compression ranges -5-1 10. She walks with an antalgic gait. Externally rotating. Slightly dynamic pes planus  Skin:    General: Skin is warm and dry.  Neurological:     Mental Status: She is alert.    X-rays of her left knee demonstrate end-stage osteoarthrosis medial  compartment with a varus deformity. There are calcifications in the lateral femoral condyle consistent with an enchondroma. 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 Free edge tear of the medial meniscus tricompartmental osteoarthrosis full-thickness cartilage loss the medial compartment large joint effusion and enchondroma of the lateral femoral condyle measuring 2.7 x 2 cm.  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 Duwayne on July 16. We again discussed the procedure itself as well as risks, complications and alternatives, including but not limited to DVT, PE, infx, bleeding, failure of procedure, need for secondary procedure including manipulation, nerve injury, ongoing pain/symptoms, anesthesia risk, even stroke or death. Also discussed typical post-op protocols, activity restrictions, need for PT, flexion/extension exercises, time out of work. Discussed need for DVT ppx post-op per protocol. Discussed dental ppx and infx prevention. Also discussed limitations post-operatively such as kneeling and squatting. All questions were answered. Patient desires to proceed with surgery as scheduled.  Will hold supplements, ASA and NSAIDs accordingly. Will remain NPO after midnight the night before surgery. She has already completed her preop testing at [facility]. Anticipate hospital stay to include at least 2 midnights given medical history and to ensure proper pain control. Plan aspirin  for DVT ppx post-op. Plan oxycodone , Robaxin , Colace, Miralax . Plan home with HHPT post-op with family members at home for assistance. Will follow up 10-14 days post-op for suture removal and xrays. We did also discuss her anemia noted on preop labs, she is to resume iron in addition to vitamin C, typically iron causes her a lot of constipation so I told her she can hold off until postoperative to resume that if surgery is only 2 days away. We will have her PCP follow-up on labs accordingly.  Will submit for approval for HHPT, walker, and 3 in 1 commode through WC.  Plan Left total knee replacement  Myrian Botello M Synai Prettyman, PA-C for Dr. Duwayne 07/09/2024, 4:10 PM

## 2024-07-11 ENCOUNTER — Ambulatory Visit (HOSPITAL_COMMUNITY): Payer: Worker's Compensation | Admitting: Physician Assistant

## 2024-07-11 ENCOUNTER — Other Ambulatory Visit: Payer: Self-pay

## 2024-07-11 ENCOUNTER — Encounter (HOSPITAL_COMMUNITY)
Admission: RE | Disposition: A | Discharge status: Home / Self Care | Payer: Self-pay | Source: Home / Self Care | Attending: Specialist

## 2024-07-11 ENCOUNTER — Ambulatory Visit (HOSPITAL_COMMUNITY): Payer: Worker's Compensation | Admitting: Anesthesiology

## 2024-07-11 ENCOUNTER — Encounter (HOSPITAL_COMMUNITY): Payer: Self-pay | Admitting: Specialist

## 2024-07-11 ENCOUNTER — Ambulatory Visit (HOSPITAL_COMMUNITY)
Admission: RE | Admit: 2024-07-11 | Discharge: 2024-07-13 | Disposition: A | Discharge status: Home / Self Care | Payer: Worker's Compensation | Attending: Specialist | Admitting: Specialist

## 2024-07-11 ENCOUNTER — Ambulatory Visit (HOSPITAL_COMMUNITY)

## 2024-07-11 DIAGNOSIS — I10 Essential (primary) hypertension: Secondary | ICD-10-CM | POA: Insufficient documentation

## 2024-07-11 DIAGNOSIS — Z96659 Presence of unspecified artificial knee joint: Secondary | ICD-10-CM

## 2024-07-11 DIAGNOSIS — M1712 Unilateral primary osteoarthritis, left knee: Secondary | ICD-10-CM | POA: Insufficient documentation

## 2024-07-11 DIAGNOSIS — M21162 Varus deformity, not elsewhere classified, left knee: Secondary | ICD-10-CM | POA: Insufficient documentation

## 2024-07-11 DIAGNOSIS — Z01818 Encounter for other preprocedural examination: Secondary | ICD-10-CM

## 2024-07-11 DIAGNOSIS — Z87891 Personal history of nicotine dependence: Secondary | ICD-10-CM | POA: Insufficient documentation

## 2024-07-11 HISTORY — PX: TOTAL KNEE ARTHROPLASTY: SHX125

## 2024-07-11 LAB — POCT PREGNANCY, URINE: Preg Test, Ur: NEGATIVE

## 2024-07-11 SURGERY — ARTHROPLASTY, KNEE, TOTAL
Anesthesia: Regional | Site: Knee | Laterality: Left

## 2024-07-11 MED ORDER — ROPIVACAINE HCL 5 MG/ML IJ SOLN
INTRAMUSCULAR | Status: DC | PRN
Start: 2024-07-11 — End: 2024-07-11
  Administered 2024-07-11: 20 mL via PERINEURAL

## 2024-07-11 MED ORDER — DOCUSATE SODIUM 100 MG PO CAPS
100.0000 mg | ORAL_CAPSULE | Freq: Two times a day (BID) | ORAL | Status: DC
  Administered 2024-07-11 – 2024-07-13 (×4): 100 mg via ORAL
  Filled 2024-07-11 (×5): qty 1

## 2024-07-11 MED ORDER — BUPIVACAINE-EPINEPHRINE (PF) 0.25% -1:200000 IJ SOLN
INTRAMUSCULAR | Status: AC
  Filled 2024-07-11: qty 30

## 2024-07-11 MED ORDER — SODIUM CHLORIDE 0.9 % IR SOLN
Status: DC | PRN
  Administered 2024-07-11: 1000 mL

## 2024-07-11 MED ORDER — PROPOFOL 500 MG/50ML IV EMUL
INTRAVENOUS | Status: DC | PRN
  Administered 2024-07-11 (×2): 10 mg via INTRAVENOUS
  Administered 2024-07-11: 50 ug/kg/min via INTRAVENOUS

## 2024-07-11 MED ORDER — ALUM & MAG HYDROXIDE-SIMETH 200-200-20 MG/5ML PO SUSP: 30.0000 mL | ORAL | Status: DC | PRN

## 2024-07-11 MED ORDER — 0.9 % SODIUM CHLORIDE (POUR BTL) OPTIME
TOPICAL | Status: DC | PRN
  Administered 2024-07-11: 1000 mL

## 2024-07-11 MED ORDER — BUPIVACAINE-EPINEPHRINE 0.25% -1:200000 IJ SOLN
INTRAMUSCULAR | Status: DC | PRN
Start: 2024-07-11 — End: 2024-07-11
  Administered 2024-07-11: 20 mL

## 2024-07-11 MED ORDER — FENTANYL CITRATE PF 50 MCG/ML IJ SOSY
PREFILLED_SYRINGE | INTRAMUSCULAR | Status: AC
Start: 2024-07-11 — End: 2024-07-11
  Filled 2024-07-11: qty 1

## 2024-07-11 MED ORDER — ACETAMINOPHEN 500 MG PO TABS: 1000.0000 mg | ORAL_TABLET | Freq: Once | ORAL | Status: DC

## 2024-07-11 MED ORDER — HYDROMORPHONE HCL 1 MG/ML IJ SOLN
0.5000 mg | INTRAMUSCULAR | Status: DC | PRN
  Administered 2024-07-12: 1 mg via INTRAVENOUS
  Filled 2024-07-11: qty 1

## 2024-07-11 MED ORDER — KCL IN DEXTROSE-NACL 20-5-0.9 MEQ/L-%-% IV SOLN
INTRAVENOUS | Status: AC
  Filled 2024-07-11: qty 1000

## 2024-07-11 MED ORDER — ACETAMINOPHEN 10 MG/ML IV SOLN
1000.0000 mg | INTRAVENOUS | Status: AC
  Administered 2024-07-11: 1000 mg via INTRAVENOUS
  Filled 2024-07-11: qty 100

## 2024-07-11 MED ORDER — BENAZEPRIL HCL 20 MG PO TABS
40.0000 mg | ORAL_TABLET | Freq: Every day | ORAL | Status: DC
  Administered 2024-07-12 – 2024-07-13 (×2): 40 mg via ORAL
  Filled 2024-07-11 (×2): qty 2

## 2024-07-11 MED ORDER — AMLODIPINE BESYLATE 10 MG PO TABS
10.0000 mg | ORAL_TABLET | Freq: Every day | ORAL | Status: DC
  Administered 2024-07-12 – 2024-07-13 (×2): 10 mg via ORAL
  Filled 2024-07-11 (×2): qty 1

## 2024-07-11 MED ORDER — CHLORHEXIDINE GLUCONATE 0.12 % MT SOLN
15.0000 mL | Freq: Once | OROMUCOSAL | Status: AC
  Administered 2024-07-11: 15 mL via OROMUCOSAL

## 2024-07-11 MED ORDER — STERILE WATER FOR IRRIGATION IR SOLN
Status: DC | PRN
  Administered 2024-07-11: 2000 mL

## 2024-07-11 MED ORDER — ONDANSETRON HCL 4 MG/2ML IJ SOLN
INTRAMUSCULAR | Status: DC | PRN
  Administered 2024-07-11: 4 mg via INTRAVENOUS

## 2024-07-11 MED ORDER — METHOCARBAMOL 500 MG PO TABS
500.0000 mg | ORAL_TABLET | Freq: Four times a day (QID) | ORAL | Status: DC | PRN
  Administered 2024-07-12 – 2024-07-13 (×3): 500 mg via ORAL
  Filled 2024-07-11 (×3): qty 1

## 2024-07-11 MED ORDER — ACETAMINOPHEN 325 MG PO TABS
325.0000 mg | ORAL_TABLET | Freq: Four times a day (QID) | ORAL | Status: DC | PRN
  Administered 2024-07-12: 650 mg via ORAL
  Filled 2024-07-11: qty 2

## 2024-07-11 MED ORDER — OXYCODONE HCL 5 MG/5ML PO SOLN: 5.0000 mg | Freq: Once | ORAL | Status: AC | PRN

## 2024-07-11 MED ORDER — ONDANSETRON HCL 4 MG PO TABS
4.0000 mg | ORAL_TABLET | Freq: Four times a day (QID) | ORAL | Status: DC | PRN
  Administered 2024-07-12: 4 mg via ORAL
  Filled 2024-07-11: qty 1

## 2024-07-11 MED ORDER — AMLODIPINE BESY-BENAZEPRIL HCL 10-40 MG PO CAPS: 1.0000 | ORAL_CAPSULE | Freq: Every day | ORAL | Status: DC

## 2024-07-11 MED ORDER — PROPOFOL 1000 MG/100ML IV EMUL
INTRAVENOUS | Status: AC
  Filled 2024-07-11: qty 100

## 2024-07-11 MED ORDER — FENTANYL CITRATE (PF) 100 MCG/2ML IJ SOLN
INTRAMUSCULAR | Status: AC
  Filled 2024-07-11: qty 2

## 2024-07-11 MED ORDER — CEFAZOLIN SODIUM-DEXTROSE 2-4 GM/100ML-% IV SOLN
2.0000 g | Freq: Four times a day (QID) | INTRAVENOUS | Status: AC
  Administered 2024-07-11 – 2024-07-12 (×2): 2 g via INTRAVENOUS
  Filled 2024-07-11 (×2): qty 100

## 2024-07-11 MED ORDER — LACTATED RINGERS IV SOLN: INTRAVENOUS | Status: DC

## 2024-07-11 MED ORDER — METOCLOPRAMIDE HCL 5 MG/ML IJ SOLN
5.0000 mg | Freq: Three times a day (TID) | INTRAMUSCULAR | Status: DC | PRN
  Administered 2024-07-11: 10 mg via INTRAVENOUS
  Filled 2024-07-11: qty 2

## 2024-07-11 MED ORDER — OXYCODONE HCL 5 MG PO TABS
5.0000 mg | ORAL_TABLET | Freq: Once | ORAL | Status: AC | PRN
  Administered 2024-07-11: 5 mg via ORAL

## 2024-07-11 MED ORDER — DEXAMETHASONE SODIUM PHOSPHATE 10 MG/ML IJ SOLN
INTRAMUSCULAR | Status: AC
  Filled 2024-07-11: qty 1

## 2024-07-11 MED ORDER — AMISULPRIDE (ANTIEMETIC) 5 MG/2ML IV SOLN: 10.0000 mg | Freq: Once | INTRAVENOUS | Status: DC | PRN

## 2024-07-11 MED ORDER — FENTANYL CITRATE PF 50 MCG/ML IJ SOSY
PREFILLED_SYRINGE | INTRAMUSCULAR | Status: AC
  Administered 2024-07-11: 50 ug
  Filled 2024-07-11: qty 2

## 2024-07-11 MED ORDER — MIDAZOLAM HCL 2 MG/2ML IJ SOLN: 2.0000 mg | Freq: Once | INTRAMUSCULAR | Status: DC

## 2024-07-11 MED ORDER — HYDROCHLOROTHIAZIDE 25 MG PO TABS
25.0000 mg | ORAL_TABLET | Freq: Every day | ORAL | Status: DC
  Administered 2024-07-12 – 2024-07-13 (×2): 25 mg via ORAL
  Filled 2024-07-11 (×2): qty 1

## 2024-07-11 MED ORDER — ORAL CARE MOUTH RINSE: 15.0000 mL | Freq: Once | OROMUCOSAL | Status: AC

## 2024-07-11 MED ORDER — AMLODIPINE BESYLATE 10 MG PO TABS: 10.0000 mg | ORAL_TABLET | Freq: Every day | ORAL | Status: DC

## 2024-07-11 MED ORDER — FENTANYL CITRATE PF 50 MCG/ML IJ SOSY: 50.0000 ug | PREFILLED_SYRINGE | Freq: Once | INTRAMUSCULAR | Status: DC

## 2024-07-11 MED ORDER — ACETAMINOPHEN 500 MG PO TABS
1000.0000 mg | ORAL_TABLET | Freq: Four times a day (QID) | ORAL | Status: AC
  Administered 2024-07-12 (×3): 1000 mg via ORAL
  Filled 2024-07-11 (×4): qty 2

## 2024-07-11 MED ORDER — METHOCARBAMOL 1000 MG/10ML IJ SOLN: 500.0000 mg | Freq: Four times a day (QID) | INTRAMUSCULAR | Status: DC | PRN

## 2024-07-11 MED ORDER — POLYETHYLENE GLYCOL 3350 17 G PO PACK
17.0000 g | PACK | Freq: Every day | ORAL | Status: DC | PRN
  Administered 2024-07-12: 17 g via ORAL
  Filled 2024-07-11: qty 1

## 2024-07-11 MED ORDER — BISACODYL 5 MG PO TBEC: 5.0000 mg | DELAYED_RELEASE_TABLET | Freq: Every day | ORAL | Status: DC | PRN

## 2024-07-11 MED ORDER — ASPIRIN 81 MG PO CHEW
81.0000 mg | CHEWABLE_TABLET | Freq: Two times a day (BID) | ORAL | Status: DC
  Administered 2024-07-12 – 2024-07-13 (×3): 81 mg via ORAL
  Filled 2024-07-11 (×3): qty 1

## 2024-07-11 MED ORDER — CEFAZOLIN SODIUM-DEXTROSE 2-4 GM/100ML-% IV SOLN
2.0000 g | INTRAVENOUS | Status: AC
  Administered 2024-07-11: 2 g via INTRAVENOUS
  Filled 2024-07-11: qty 100

## 2024-07-11 MED ORDER — FENTANYL CITRATE (PF) 100 MCG/2ML IJ SOLN
INTRAMUSCULAR | Status: DC | PRN
  Administered 2024-07-11: 50 ug via INTRAVENOUS
  Administered 2024-07-11 (×2): 25 ug via INTRAVENOUS

## 2024-07-11 MED ORDER — BUPIVACAINE IN DEXTROSE 0.75-8.25 % IT SOLN
INTRATHECAL | Status: DC | PRN
  Administered 2024-07-11: 1.8 mL via INTRATHECAL

## 2024-07-11 MED ORDER — ONDANSETRON HCL 4 MG/2ML IJ SOLN
4.0000 mg | Freq: Four times a day (QID) | INTRAMUSCULAR | Status: DC | PRN
  Administered 2024-07-11: 4 mg via INTRAVENOUS
  Filled 2024-07-11: qty 2

## 2024-07-11 MED ORDER — ONDANSETRON HCL 4 MG/2ML IJ SOLN
INTRAMUSCULAR | Status: AC
  Filled 2024-07-11: qty 2

## 2024-07-11 MED ORDER — PHENOL 1.4 % MT LIQD: 1.0000 | OROMUCOSAL | Status: DC | PRN

## 2024-07-11 MED ORDER — GLYCOPYRROLATE 0.2 MG/ML IJ SOLN
INTRAMUSCULAR | Status: AC
  Filled 2024-07-11: qty 1

## 2024-07-11 MED ORDER — MIDAZOLAM HCL 2 MG/2ML IJ SOLN
INTRAMUSCULAR | Status: AC
Start: 2024-07-11 — End: 2024-07-11
  Administered 2024-07-11: 2 mg
  Filled 2024-07-11: qty 2

## 2024-07-11 MED ORDER — SODIUM CHLORIDE (PF) 0.9 % IJ SOLN
INTRAMUSCULAR | Status: DC | PRN
  Administered 2024-07-11: 60 mL

## 2024-07-11 MED ORDER — DIPHENHYDRAMINE HCL 12.5 MG/5ML PO ELIX: 12.5000 mg | ORAL_SOLUTION | ORAL | Status: DC | PRN

## 2024-07-11 MED ORDER — METOCLOPRAMIDE HCL 5 MG PO TABS: 5.0000 mg | ORAL_TABLET | Freq: Three times a day (TID) | ORAL | Status: DC | PRN

## 2024-07-11 MED ORDER — TRANEXAMIC ACID-NACL 1000-0.7 MG/100ML-% IV SOLN
1000.0000 mg | INTRAVENOUS | Status: AC
  Administered 2024-07-11: 1000 mg via INTRAVENOUS
  Filled 2024-07-11: qty 100

## 2024-07-11 MED ORDER — SODIUM CHLORIDE 0.9 % IR SOLN
Status: DC | PRN
Start: 2024-07-11 — End: 2024-07-11
  Administered 2024-07-11: 3000 mL

## 2024-07-11 MED ORDER — MAGNESIUM CITRATE PO SOLN: 1.0000 | Freq: Once | ORAL | Status: DC | PRN

## 2024-07-11 MED ORDER — SODIUM CHLORIDE (PF) 0.9 % IJ SOLN
INTRAMUSCULAR | Status: AC
  Filled 2024-07-11: qty 50

## 2024-07-11 MED ORDER — LIDOCAINE HCL (CARDIAC) PF 100 MG/5ML IV SOSY
PREFILLED_SYRINGE | INTRAVENOUS | Status: DC | PRN
  Administered 2024-07-11: 30 mg via INTRAVENOUS

## 2024-07-11 MED ORDER — LIDOCAINE HCL (PF) 2 % IJ SOLN
INTRAMUSCULAR | Status: AC
  Filled 2024-07-11: qty 5

## 2024-07-11 MED ORDER — OXYCODONE HCL 5 MG PO TABS
10.0000 mg | ORAL_TABLET | ORAL | Status: DC | PRN
  Administered 2024-07-11: 10 mg via ORAL
  Administered 2024-07-12: 15 mg via ORAL
  Administered 2024-07-12 – 2024-07-13 (×4): 10 mg via ORAL
  Filled 2024-07-11: qty 2
  Filled 2024-07-11: qty 3
  Filled 2024-07-11 (×2): qty 2

## 2024-07-11 MED ORDER — MENTHOL 3 MG MT LOZG: 1.0000 | LOZENGE | OROMUCOSAL | Status: DC | PRN

## 2024-07-11 MED ORDER — RISAQUAD PO CAPS
1.0000 | ORAL_CAPSULE | Freq: Every day | ORAL | Status: DC
  Administered 2024-07-12 – 2024-07-13 (×2): 1 via ORAL
  Filled 2024-07-11 (×2): qty 1

## 2024-07-11 MED ORDER — FENTANYL CITRATE PF 50 MCG/ML IJ SOSY
25.0000 ug | PREFILLED_SYRINGE | INTRAMUSCULAR | Status: DC | PRN
  Administered 2024-07-11: 50 ug via INTRAVENOUS

## 2024-07-11 MED ORDER — OXYCODONE HCL 5 MG PO TABS
ORAL_TABLET | ORAL | Status: AC
  Filled 2024-07-11: qty 1

## 2024-07-11 MED ORDER — DEXAMETHASONE SODIUM PHOSPHATE 10 MG/ML IJ SOLN
INTRAMUSCULAR | Status: DC | PRN
Start: 2024-07-11 — End: 2024-07-11
  Administered 2024-07-11: 10 mg

## 2024-07-11 MED ORDER — BUPIVACAINE LIPOSOME 1.3 % IJ SUSP
INTRAMUSCULAR | Status: AC
  Filled 2024-07-11: qty 20

## 2024-07-11 MED ORDER — B COMPLEX-C PO TABS
1.0000 | ORAL_TABLET | Freq: Every day | ORAL | Status: DC
  Administered 2024-07-12 – 2024-07-13 (×2): 1 via ORAL
  Filled 2024-07-11 (×2): qty 1

## 2024-07-11 MED ORDER — OXYCODONE HCL 5 MG PO TABS
5.0000 mg | ORAL_TABLET | ORAL | Status: DC | PRN
  Administered 2024-07-11 – 2024-07-13 (×3): 10 mg via ORAL
  Filled 2024-07-11 (×5): qty 2

## 2024-07-11 SURGICAL SUPPLY — 58 items
ATTUNE MED DOME PAT 32 KNEE (Knees) IMPLANT
ATTUNE PS FEM LT SZ 6 CEM KNEE (Femur) IMPLANT
ATTUNE PSRP INSR SZ6 8 KNEE (Insert) IMPLANT
BAG COUNTER SPONGE SURGICOUNT (BAG) IMPLANT
BAG ZIPLOCK 12X15 (MISCELLANEOUS) IMPLANT
BASE TIBIAL ROT PLAT SZ 5 KNEE (Knees) IMPLANT
BLADE SAW SGTL 11.0X1.19X90.0M (BLADE) ×1 IMPLANT
BLADE SAW SGTL 13.0X1.19X90.0M (BLADE) ×1 IMPLANT
BLADE SURG SZ10 CARB STEEL (BLADE) ×2 IMPLANT
BNDG ELASTIC 3INX 5YD STR LF (GAUZE/BANDAGES/DRESSINGS) IMPLANT
BNDG ELASTIC 4INX 5YD STR LF (GAUZE/BANDAGES/DRESSINGS) ×1 IMPLANT
BNDG ELASTIC 6INX 5YD STR LF (GAUZE/BANDAGES/DRESSINGS) ×1 IMPLANT
BOWL SMART MIX CTS (DISPOSABLE) ×1 IMPLANT
CEMENT HV SMART SET (Cement) ×2 IMPLANT
CNTNR URN SCR LID CUP LEK RST (MISCELLANEOUS) IMPLANT
COVER SURGICAL LIGHT HANDLE (MISCELLANEOUS) ×1 IMPLANT
CUFF TRNQT CYL 34X4.125X (TOURNIQUET CUFF) ×1 IMPLANT
DRAPE INCISE IOBAN 66X45 STRL (DRAPES) IMPLANT
DRAPE SHEET LG 3/4 BI-LAMINATE (DRAPES) ×1 IMPLANT
DRAPE SURG ORHT 6 SPLT 77X108 (DRAPES) ×2 IMPLANT
DRAPE TOP 10253 STERILE (DRAPES) ×1 IMPLANT
DRAPE U-SHAPE 47X51 STRL (DRAPES) ×1 IMPLANT
DRSG AQUACEL AG ADV 3.5X10 (GAUZE/BANDAGES/DRESSINGS) ×1 IMPLANT
DURAPREP 26ML APPLICATOR (WOUND CARE) ×1 IMPLANT
ELECT BLADE TIP CTD 4 INCH (ELECTRODE) ×1 IMPLANT
ELECT PENCIL ROCKER SW 15FT (MISCELLANEOUS) ×1 IMPLANT
ELECT REM PT RETURN 15FT ADLT (MISCELLANEOUS) ×1 IMPLANT
GLOVE BIOGEL PI IND STRL 7.0 (GLOVE) ×1 IMPLANT
GLOVE BIOGEL PI IND STRL 8 (GLOVE) ×1 IMPLANT
GLOVE SURG SS PI 7.0 STRL IVOR (GLOVE) ×1 IMPLANT
GLOVE SURG SS PI 8.0 STRL IVOR (GLOVE) ×2 IMPLANT
GOWN STRL REUS W/ TWL XL LVL3 (GOWN DISPOSABLE) ×2 IMPLANT
HEMOSTAT SPONGE AVITENE ULTRA (HEMOSTASIS) IMPLANT
HOLDER FOLEY CATH W/STRAP (MISCELLANEOUS) ×1 IMPLANT
IMMOBILIZER KNEE 20 THIGH 36 (SOFTGOODS) ×1 IMPLANT
KIT TURNOVER KIT A (KITS) ×1 IMPLANT
MANIFOLD NEPTUNE II (INSTRUMENTS) ×1 IMPLANT
NS IRRIG 1000ML POUR BTL (IV SOLUTION) IMPLANT
PACK TOTAL KNEE CUSTOM (KITS) ×1 IMPLANT
PIN STEINMAN FIXATION KNEE (PIN) IMPLANT
PROTECTOR NERVE ULNAR (MISCELLANEOUS) ×1 IMPLANT
SEALER BIPOLAR AQUA 6.0 (INSTRUMENTS) IMPLANT
SET HNDPC FAN SPRY TIP SCT (DISPOSABLE) ×1 IMPLANT
SOLUTION PRONTOSAN WOUND 350ML (IRRIGATION / IRRIGATOR) ×1 IMPLANT
SPIKE FLUID TRANSFER (MISCELLANEOUS) ×1 IMPLANT
STAPLER VISISTAT (STAPLE) IMPLANT
STRIP CLOSURE SKIN 1/2X4 (GAUZE/BANDAGES/DRESSINGS) IMPLANT
SUT BONE WAX W31G (SUTURE) ×1 IMPLANT
SUT MNCRL AB 4-0 PS2 18 (SUTURE) IMPLANT
SUT VIC AB 1 CT1 27XBRD ANTBC (SUTURE) ×3 IMPLANT
SUT VIC AB 2-0 CT1 TAPERPNT 27 (SUTURE) ×3 IMPLANT
SUTURE STRATFX 0 PDS 27 VIOLET (SUTURE) ×1 IMPLANT
SYR 30ML LL (SYRINGE) IMPLANT
TRAY FOLEY MTR SLVR 16FR STAT (SET/KITS/TRAYS/PACK) ×1 IMPLANT
TUBE SUCTION HIGH CAP CLEAR NV (SUCTIONS) ×1 IMPLANT
WATER STERILE IRR 1000ML POUR (IV SOLUTION) ×1 IMPLANT
WIPE CHG 2% PREP (PERSONAL CARE ITEMS) ×1 IMPLANT
WRAP KNEE MAXI GEL POST OP (GAUZE/BANDAGES/DRESSINGS) ×1 IMPLANT

## 2024-07-11 NOTE — Anesthesia Procedure Notes (Signed)
 Spinal  Patient location during procedure: OR Start time: 07/11/2024 11:30 AM End time: 07/11/2024 11:34 AM Reason for block: surgical anesthesia Staffing Performed: resident/CRNA  Resident/CRNA: Kathern Rollene LABOR, CRNA Performed by: Kathern Rollene LABOR, CRNA Authorized by: Niels Marien CROME, MD   Preanesthetic Checklist Completed: patient identified, IV checked, site marked, risks and benefits discussed, surgical consent, monitors and equipment checked, pre-op evaluation and timeout performed Spinal Block Patient position: sitting Prep: DuraPrep Patient monitoring: heart rate, continuous pulse ox and blood pressure Approach: midline Location: L4-5 Injection technique: single-shot Needle Needle type: Sprotte  Needle gauge: 24 G Needle length: 9 cm Needle insertion depth: 5 cm Assessment Sensory level: T8 Events: CSF return Additional Notes

## 2024-07-11 NOTE — Brief Op Note (Signed)
 07/11/2024  10:43 AM  PATIENT:  Martha Thomas  48 y.o. female  PRE-OPERATIVE DIAGNOSIS:  Left degerative joint disease knee  POST-OPERATIVE DIAGNOSIS:  same PROCEDURE:  Procedure(s): ARTHROPLASTY, KNEE, TOTAL (Left)  The aquamantis was utilized for this case to help facilitate better hemostasis as patient was felt to be at increased risk of bleeding because of obesity.   SURGEON:  Surgeons and Role:    * Duwayne Purchase, MD - Primary  PHYSICIAN ASSISTANT:   ASSISTANTS: Bissell   ANESTHESIA:   spinal  EBL:  50   BLOOD ADMINISTERED:none  DRAINS: none   LOCAL MEDICATIONS USED:  MARCAINE      SPECIMEN:  bone distal femur  DISPOSITION OF SPECIMEN:  N/A  COUNTS:  YES  TOURNIQUET:  55 min   DICTATION: .Other Dictation: Dictation Number [id number]  PLAN OF CARE: Admit for overnight observation  PATIENT DISPOSITION:  PACU - hemodynamically stable.   Delay start of Pharmacological VTE agent (>24hrs) due to surgical blood loss or risk of bleeding: no

## 2024-07-11 NOTE — Transfer of Care (Signed)
 Immediate Anesthesia Transfer of Care Note  Patient: Martha Thomas  Procedure(s) Performed: ARTHROPLASTY, KNEE, TOTAL (Left: Knee)  Patient Location: PACU  Anesthesia Type:Spinal  Level of Consciousness: awake, alert , oriented, and patient cooperative  Airway & Oxygen Therapy: Patient Spontanous Breathing and Patient connected to face mask oxygen  Post-op Assessment: Report given to RN and Post -op Vital signs reviewed and stable  Post vital signs: Reviewed and stable  Last Vitals:  Vitals Value Taken Time  BP 126/82 07/11/24 13:50  Temp    Pulse 70 07/11/24 13:53  Resp 16 07/11/24 13:53  SpO2 100 % 07/11/24 13:53  Vitals shown include unfiled device data.  Last Pain:  Vitals:   07/11/24 1055  TempSrc:   PainSc: Asleep         Complications: No notable events documented.

## 2024-07-11 NOTE — Discharge Instructions (Signed)
 Resume iron   Elevate leg above heart 6x a day for each Use knee immobilizer while walking until can SLR x 10 Use knee immobilizer in bed to keep knee in extension Aquacel dressing may remain in place for one week. May shower with aquacel dressing in place but do not soak. If the dressing becomes saturated or peels off, you may remove aquacel dressing. After one week post-op, remove aquacel dressing. Do not remove steri-strips if they are present. Place new dressing with gauze and tape or ACE bandage which should be kept clean and dry and changed daily.  INSTRUCTIONS AFTER JOINT REPLACEMENT   Remove items at home which could result in a fall. This includes throw rugs or furniture in walking pathways ICE to the affected joint every three hours while awake for 30 minutes at a time, for at least the first 3-5 days, and then as needed for pain and swelling.  Continue to use ice for pain and swelling. You may notice swelling that will progress down to the foot and ankle.  This is normal after surgery.  Elevate your leg when you are not up walking on it.   Continue to use the breathing machine you got in the hospital (incentive spirometer) which will help keep your temperature down.  It is common for your temperature to cycle up and down following surgery, especially at night when you are not up moving around and exerting yourself.  The breathing machine keeps your lungs expanded and your temperature down.   DIET:  As you were doing prior to hospitalization, we recommend a well-balanced diet.  DRESSING / WOUND CARE / SHOWERING  Your dressing is waterproof. You may shower with this dressing but do not submerge it in water . You may change your dressing 7 days after surgery.  Then change the dressing every day with sterile gauze.  Please use good hand washing techniques before changing the dressing.  Do not use any lotions or creams on the incision until instructed by your  surgeon.  ACTIVITY  Increase activity slowly as tolerated, but follow the weight bearing instructions below.   No driving for 6 weeks or until further direction given by your physician.  You cannot drive while taking narcotics.  No lifting or carrying greater than 10 lbs. until further directed by your surgeon. Avoid periods of inactivity such as sitting longer than an hour when not asleep. This helps prevent blood clots.  You may return to work once you are authorized by your doctor.     WEIGHT BEARING   Weight bearing as tolerated with assist device (walker, cane, etc) as directed, use it as long as suggested by your surgeon or therapist, typically at least 4-6 weeks.   EXERCISES  Results after joint replacement surgery are often greatly improved when you follow the exercise, range of motion and muscle strengthening exercises prescribed by your doctor. Safety measures are also important to protect the joint from further injury. Any time any of these exercises cause you to have increased pain or swelling, decrease what you are doing until you are comfortable again and then slowly increase them. If you have problems or questions, call your caregiver or physical therapist for advice.   Rehabilitation is important following a joint replacement. After just a few days of immobilization, the muscles of the leg can become weakened and shrink (atrophy).  These exercises are designed to build up the tone and strength of the thigh and leg muscles and to improve  motion. Often times heat used for twenty to thirty minutes before working out will loosen up your tissues and help with improving the range of motion but do not use heat for the first two weeks following surgery (sometimes heat can increase post-operative swelling).   These exercises can be done on a training (exercise) mat, on the floor, on a table or on a bed. Use whatever works the best and is most comfortable for you.    Use music or  television while you are exercising so that the exercises are a pleasant break in your day. This will make your life better with the exercises acting as a break in your routine that you can look forward to.   Perform all exercises about fifteen times, three times per day or as directed.  You should exercise both the operative leg and the other leg as well.  Exercises include:   Quad Sets - Tighten up the muscle on the front of the thigh (Quad) and hold for 5-10 seconds.   Straight Leg Raises - With your knee straight (if you were given a brace, keep it on), lift the leg to 60 degrees, hold for 3 seconds, and slowly lower the leg.  Perform this exercise against resistance later as your leg gets stronger.  Leg Slides: Lying on your back, slowly slide your foot toward your buttocks, bending your knee up off the floor (only go as far as is comfortable). Then slowly slide your foot back down until your leg is flat on the floor again.  Angel Wings: Lying on your back spread your legs to the side as far apart as you can without causing discomfort.  Hamstring Strength:  Lying on your back, push your heel against the floor with your leg straight by tightening up the muscles of your buttocks.  Repeat, but this time bend your knee to a comfortable angle, and push your heel against the floor.  You may put a pillow under the heel to make it more comfortable if necessary.   A rehabilitation program following joint replacement surgery can speed recovery and prevent re-injury in the future due to weakened muscles. Contact your doctor or a physical therapist for more information on knee rehabilitation.    CONSTIPATION  Constipation is defined medically as fewer than three stools per week and severe constipation as less than one stool per week.  Even if you have a regular bowel pattern at home, your normal regimen is likely to be disrupted due to multiple reasons following surgery.  Combination of anesthesia,  postoperative narcotics, change in appetite and fluid intake all can affect your bowels.   YOU MUST use at least one of the following options; they are listed in order of increasing strength to get the job done.  They are all available over the counter, and you may need to use some, POSSIBLY even all of these options:    Drink plenty of fluids (prune juice may be helpful) and high fiber foods Colace 100 mg by mouth twice a day  Senokot for constipation as directed and as needed Dulcolax (bisacodyl ), take with full glass of water   Miralax  (polyethylene glycol) once or twice a day as needed.  If you have tried all these things and are unable to have a bowel movement in the first 3-4 days after surgery call either your surgeon or your primary doctor.    If you experience loose stools or diarrhea, hold the medications until you stool forms back up.  If your symptoms do not get better within 1 week or if they get worse, check with your doctor.  If you experience the worst abdominal pain ever or develop nausea or vomiting, please contact the office immediately for further recommendations for treatment.   ITCHING:  If you experience itching with your medications, try taking only a single pain pill, or even half a pain pill at a time.  You can also use Benadryl  over the counter for itching or also to help with sleep.   TED HOSE STOCKINGS:  Use stockings on both legs until for at least 2 weeks or as directed by physician office. They may be removed at night for sleeping.  MEDICATIONS:  See your medication summary on the "After Visit Summary" that nursing will review with you.  You may have some home medications which will be placed on hold until you complete the course of blood thinner medication.  It is important for you to complete the blood thinner medication as prescribed.  PRECAUTIONS:  If you experience chest pain or shortness of breath - call 911 immediately for transfer to the hospital emergency  department.   If you develop a fever greater that 101 F, purulent drainage from wound, increased redness or drainage from wound, foul odor from the wound/dressing, or calf pain - CONTACT YOUR SURGEON.                                                   FOLLOW-UP APPOINTMENTS:  If you do not already have a post-op appointment, please call the office for an appointment to be seen by your surgeon.  Guidelines for how soon to be seen are listed in your "After Visit Summary", but are typically between 1-4 weeks after surgery.  OTHER INSTRUCTIONS:   Knee Replacement:  Do not place pillow under knee, focus on keeping the knee straight while resting. CPM instructions: 0-90 degrees, 2 hours in the morning, 2 hours in the afternoon, and 2 hours in the evening. Place foam block, curve side up under heel at all times except when in CPM or when walking.  DO NOT modify, tear, cut, or change the foam block in any way.  POST-OPERATIVE OPIOID TAPER INSTRUCTIONS: It is important to wean off of your opioid medication as soon as possible. If you do not need pain medication after your surgery it is ok to stop day one. Opioids include: Codeine, Hydrocodone(Norco, Vicodin), Oxycodone (Percocet, oxycontin ) and hydromorphone  amongst others.  Long term and even short term use of opiods can cause: Increased pain response Dependence Constipation Depression Respiratory depression And more.  Withdrawal symptoms can include Flu like symptoms Nausea, vomiting And more Techniques to manage these symptoms Hydrate well Eat regular healthy meals Stay active Use relaxation techniques(deep breathing, meditating, yoga) Do Not substitute Alcohol to help with tapering If you have been on opioids for less than two weeks and do not have pain than it is ok to stop all together.  Plan to wean off of opioids This plan should start within one week post op of your joint replacement. Maintain the same interval or time between taking  each dose and first decrease the dose.  Cut the total daily intake of opioids by one tablet each day Next start to increase the time between doses. The last dose that should be eliminated is the evening  dose.   MAKE SURE YOU:  Understand these instructions.  Get help right away if you are not doing well or get worse.    Thank you for letting us  be a part of your medical care team.  It is a privilege we respect greatly.  We hope these instructions will help you stay on track for a fast and full recovery!

## 2024-07-11 NOTE — Plan of Care (Signed)
   Problem: Activity: Goal: Risk for activity intolerance will decrease Outcome: Progressing   Problem: Pain Managment: Goal: General experience of comfort will improve and/or be controlled Outcome: Progressing   Problem: Safety: Goal: Ability to remain free from injury will improve Outcome: Progressing

## 2024-07-11 NOTE — Anesthesia Procedure Notes (Signed)
 Anesthesia Regional Block: Adductor canal block   Pre-Anesthetic Checklist: , timeout performed,  Correct Patient, Correct Site, Correct Laterality,  Correct Procedure, Correct Position, site marked,  Risks and benefits discussed,  Pre-op evaluation,  At surgeon's request and post-op pain management  Laterality: Left  Prep: Maximum Sterile Barrier Precautions used, chloraprep       Needles:  Injection technique: Single-shot  Needle Type: Echogenic Stimulator Needle     Needle Length: 9cm  Needle Gauge: 21     Additional Needles:   Procedures:,,,, ultrasound used (permanent image in chart),,    Narrative:  Start time: 07/11/2024 10:40 AM End time: 07/11/2024 10:50 AM Injection made incrementally with aspirations every 5 mL. Anesthesiologist: Niels Marien CROME, MD

## 2024-07-11 NOTE — Interval H&P Note (Signed)
 History and Physical Interval Note:  07/11/2024 10:43 AM  Martha Thomas  has presented today for surgery, with the diagnosis of Left degerative joint disease knee.  The various methods of treatment have been discussed with the patient and family. After consideration of risks, benefits and other options for treatment, the patient has consented to  Procedure(s): ARTHROPLASTY, KNEE, TOTAL (Left) as a surgical intervention.  The patient's history has been reviewed, patient examined, no change in status, stable for surgery.  I have reviewed the patient's chart and labs.  Questions were answered to the patient's satisfaction.     Reyes JAYSON Billing

## 2024-07-11 NOTE — Evaluation (Signed)
 Physical Therapy Evaluation Patient Details Name: Martha Thomas MRN: [id number] DOB: 1976-10-08 Today's Date: 07/11/2024  History of Present Illness  48 yo female presents to therapy s/p L TKA on 07/11/2024 due to failure of conservative measures. Pt reports sustaining fall 11/2023 at work and L knee dysfunction and pain since that time. Pt PMH includes but is not limited to: arthritis and HTN.  Clinical Impression      Martha Thomas is a 48 y.o. female POD 0 s/p L TKA. Patient reports mod I with mobility at baseline. Patient is now limited by functional impairments (see PT problem list below) and requires min A for bed mobility and min A for transfers. Patient was able to ambulate 3 feet with RW and CGA level of assist. Patient instructed in exercise to facilitate ROM and circulation to manage edema.  Patient will benefit from continued skilled PT interventions to address impairments and progress towards PLOF. Acute PT will follow to progress mobility  in preparation for safe discharge home with support of family and significant other with HH serivces.     If plan is discharge home, recommend the following: A little help with walking and/or transfers;A little help with bathing/dressing/bathroom;Assistance with cooking/housework;Assist for transportation;Help with stairs or ramp for entrance   Can travel by private vehicle        Equipment Recommendations Rolling walker (2 wheels);BSC/3in1  Recommendations for Other Services       Functional Status Assessment Patient has had a recent decline in their functional status and demonstrates the ability to make significant improvements in function in a reasonable and predictable amount of time.     Precautions / Restrictions Precautions Precautions: Fall;Knee Restrictions Weight Bearing Restrictions Per Provider Order: No      Mobility  Bed Mobility Overal bed mobility: Needs Assistance Bed Mobility: Supine to Sit     Supine to sit:  Min assist, HOB elevated, Used rails     General bed mobility comments: increased time and cues    Transfers Overall transfer level: Needs assistance Equipment used: Rolling walker (2 wheels) Transfers: Sit to/from Stand Sit to Stand: Min assist           General transfer comment: min cues pt able to push to stand with min A to power up    Ambulation/Gait Ambulation/Gait assistance: Contact guard assist Gait Distance (Feet): 3 Feet Assistive device: Rolling walker (2 wheels) Gait Pattern/deviations: Step-to pattern, Antalgic, Trunk flexed, Decreased stance time - left Gait velocity: decreased     General Gait Details: gait tasks limited due to reports of nausea and dizziness. pt had been provided medicaiton to address nausea ~ 30 min prior to PT arrival, Bp s/p seated in recliner 131/75 (78 PR)  Stairs            Wheelchair Mobility     Tilt Bed    Modified Rankin (Stroke Patients Only)       Balance Overall balance assessment: Needs assistance, History of Falls Sitting-balance support: Feet supported Sitting balance-Leahy Scale: Fair     Standing balance support: Bilateral upper extremity supported, During functional activity, Reliant on assistive device for balance Standing balance-Leahy Scale: Poor                               Pertinent Vitals/Pain Pain Assessment Pain Assessment: 0-10 Pain Score: 7  Pain Location: L LE and knee Pain Descriptors / Indicators: Aching, Constant, Discomfort, Grimacing,  Operative site guarding Pain Intervention(s): Limited activity within patient's tolerance, Monitored during session, Repositioned, Premedicated before session, Ice applied    Home Living Family/patient expects to be discharged to:: Private residence Living Arrangements: Spouse/significant other Available Help at Discharge: Family;Friend(s) Type of Home: House Home Access: Level entry       Home Layout: One level Home Equipment:  Cane - single point      Prior Function Prior Level of Function : Independent/Modified Independent             Mobility Comments: mod I for all ADLs, self care tasks and IADLs with use of [facility]       Extremity/Trunk Assessment        Lower Extremity Assessment Lower Extremity Assessment: LLE deficits/detail LLE Deficits / Details: ankle DF/PF 4/5; SLR AA LLE Sensation: WNL    Cervical / Trunk Assessment Cervical / Trunk Assessment: Normal  Communication   Communication Communication: No apparent difficulties    Cognition Arousal: Alert Behavior During Therapy: WFL for tasks assessed/performed   PT - Cognitive impairments: No apparent impairments                         Following commands: Intact       Cueing       General Comments      Exercises Total Joint Exercises Ankle Circles/Pumps: AROM, Both, 10 reps   Assessment/Plan    PT Assessment Patient needs continued PT services  PT Problem List Decreased strength;Decreased range of motion;Decreased activity tolerance;Decreased balance;Decreased mobility;Decreased coordination;Pain       PT Treatment Interventions DME instruction;Gait training;Functional mobility training;Therapeutic activities;Therapeutic exercise;Balance training;Neuromuscular re-education;Patient/family education;Modalities    PT Goals (Current goals can be found in the Care Plan section)  Acute Rehab PT Goals Patient Stated Goal: to be able to walk with out a cane and get back to normal activities PT Goal Formulation: With patient Time For Goal Achievement: 07/25/24 Potential to Achieve Goals: Good    Frequency 7X/week     Co-evaluation               AM-PAC PT 6 Clicks Mobility  Outcome Measure Help needed turning from your back to your side while in a flat bed without using bedrails?: A Little Help needed moving from lying on your back to sitting on the side of a flat bed without using bedrails?: A  Little Help needed moving to and from a bed to a chair (including a wheelchair)?: A Little Help needed standing up from a chair using your arms (e.g., wheelchair or bedside chair)?: A Little Help needed to walk in hospital room?: A Lot Help needed climbing 3-5 steps with a railing? : Total 6 Click Score: 15    End of Session Equipment Utilized During Treatment: Gait belt Activity Tolerance: Patient limited by pain;Patient limited by fatigue;Other (comment) (dizziness and nausea) Patient left: in chair;with call bell/phone within reach;with chair alarm set Nurse Communication: Mobility status PT Visit Diagnosis: Unsteadiness on feet (R26.81);Other abnormalities of gait and mobility (R26.89);Muscle weakness (generalized) (M62.81);History of falling (Z91.81);Difficulty in walking, not elsewhere classified (R26.2);Pain Pain - Right/Left: Left Pain - part of body: Knee;Leg    Time: 8180-8155 PT Time Calculation (min) (ACUTE ONLY): 25 min   Charges:   PT Evaluation $PT Eval Low Complexity: 1 Low PT Treatments $Therapeutic Activity: 8-22 mins PT General Charges $$ ACUTE PT VISIT: 1 Visit         [place], PT Acute Rehab  [place] VEAR Drone 07/11/2024, 7:11 PM

## 2024-07-11 NOTE — Anesthesia Preprocedure Evaluation (Addendum)
 Anesthesia Evaluation  Patient identified by MRN, date of birth, ID band Patient awake    Reviewed: Allergy & Precautions, NPO status , Patient's Chart, lab work & pertinent test results  Airway Mallampati: I  TM Distance: >3 FB Neck ROM: Full    Dental  (+) Chipped, Dental Advisory Given,    Pulmonary former smoker   Pulmonary exam normal breath sounds clear to auscultation       Cardiovascular hypertension, Pt. on medications Normal cardiovascular exam Rhythm:Regular Rate:Normal     Neuro/Psych negative neurological ROS  negative psych ROS   GI/Hepatic negative GI ROS, Neg liver ROS,,,  Endo/Other    Class 3 obesity (BMI 40)  Renal/GU negative Renal ROS  negative genitourinary   Musculoskeletal  (+) Arthritis ,    Abdominal   Peds  Hematology negative hematology ROS (+)   Anesthesia Other Findings   Reproductive/Obstetrics                              Anesthesia Physical Anesthesia Plan  ASA: 2  Anesthesia Plan: Spinal and Regional   Post-op Pain Management: Regional block* and Tylenol  PO (pre-op)*   Induction:   PONV Risk Score and Plan: 2 and Treatment may vary due to age or medical condition, Midazolam , Dexamethasone , Ondansetron  and Propofol  infusion  Airway Management Planned: Natural Airway  Additional Equipment:   Intra-op Plan:   Post-operative Plan:   Informed Consent: I have reviewed the patients History and Physical, chart, labs and discussed the procedure including the risks, benefits and alternatives for the proposed anesthesia with the patient or authorized representative who has indicated his/her understanding and acceptance.     Dental advisory given  Plan Discussed with: CRNA  Anesthesia Plan Comments:          Anesthesia Quick Evaluation

## 2024-07-12 ENCOUNTER — Encounter (HOSPITAL_COMMUNITY): Payer: Self-pay | Admitting: Specialist

## 2024-07-12 DIAGNOSIS — M1712 Unilateral primary osteoarthritis, left knee: Secondary | ICD-10-CM | POA: Diagnosis not present

## 2024-07-12 LAB — CBC
HCT: 35.6 % — ABNORMAL LOW (ref 36.0–46.0)
Hemoglobin: 11.1 g/dL — ABNORMAL LOW (ref 12.0–15.0)
MCH: 24 pg — ABNORMAL LOW (ref 26.0–34.0)
MCHC: 31.2 g/dL (ref 30.0–36.0)
MCV: 76.9 fL — ABNORMAL LOW (ref 80.0–100.0)
Platelets: 322 10*3/uL (ref 150–400)
RBC: 4.63 MIL/uL (ref 3.87–5.11)
RDW: 15.6 % — ABNORMAL HIGH (ref 11.5–15.5)
WBC: 9.4 10*3/uL (ref 4.0–10.5)
nRBC: 0 % (ref 0.0–0.2)

## 2024-07-12 LAB — BASIC METABOLIC PANEL WITH GFR
Anion gap: 14 (ref 5–15)
BUN: 13 mg/dL (ref 6–20)
CO2: 24 mmol/L (ref 22–32)
Calcium: 8.5 mg/dL — ABNORMAL LOW (ref 8.9–10.3)
Chloride: 96 mmol/L — ABNORMAL LOW (ref 98–111)
Creatinine, Ser: 1.04 mg/dL — ABNORMAL HIGH (ref 0.44–1.00)
GFR, Estimated: >60 mL/min (ref >60)
Glucose, Bld: 161 mg/dL — ABNORMAL HIGH (ref 70–99)
Potassium: 3.2 mmol/L — ABNORMAL LOW (ref 3.5–5.1)
Sodium: 134 mmol/L — ABNORMAL LOW (ref 135–145)

## 2024-07-12 MED ORDER — ASPIRIN 81 MG PO TBEC: 81.0000 mg | DELAYED_RELEASE_TABLET | Freq: Two times a day (BID) | ORAL | 1 refills | Status: AC

## 2024-07-12 MED ORDER — OXYCODONE HCL 10 MG PO TABS: 10.0000 mg | ORAL_TABLET | ORAL | 0 refills | Status: AC | PRN

## 2024-07-12 MED ORDER — POLYETHYLENE GLYCOL 3350 17 G PO PACK
17.0000 g | PACK | Freq: Every day | ORAL | 0 refills | Status: AC
Start: 2024-07-12 — End: ?

## 2024-07-12 MED ORDER — B COMPLEX-C PO TABS: 1.0000 | ORAL_TABLET | Freq: Every day | ORAL | 1 refills | Status: AC

## 2024-07-12 MED ORDER — DOCUSATE SODIUM 100 MG PO CAPS
100.0000 mg | ORAL_CAPSULE | Freq: Two times a day (BID) | ORAL | 2 refills | Status: AC
Start: 2024-07-12 — End: 2025-07-12

## 2024-07-12 NOTE — Progress Notes (Addendum)
 Subjective: 1 Day Post-Op Procedure(s) (LRB): ARTHROPLASTY, KNEE, TOTAL (Left) Patient reports pain as mild.  Nausea yesterday, improved. Reports tightness in thigh (tourniquet). No other c/o. Has not voided yet but foley removed this AM.  Objective: Vital signs in last 24 hours: Temp:  [97.7 F (36.5 C)-98.7 F (37.1 C)] 98.7 F (37.1 C) (07/17 0617) Pulse Rate:  [61-95] 91 (07/17 0617) Resp:  [11-20] 16 (07/17 0617) BP: (126-187)/(82-130) 148/91 (07/17 0617) SpO2:  [91 %-100 %] 97 % (07/17 0617) Weight:  [103 kg] 103 kg (07/16 1652)  Intake/Output from previous day: 07/16 0701 - 07/17 0700 In: 2020 [P.O.:720; I.V.:1000; IV Piggyback:300] Out: 1430 [Urine:1390; Blood:40] Intake/Output this shift: No intake/output data recorded.  Recent Labs    07/12/24 0345  HGB 11.1*   Recent Labs    07/12/24 0345  WBC 9.4  RBC 4.63  HCT 35.6*  PLT 322   Recent Labs    07/12/24 0345  NA 134*  K 3.2*  CL 96*  CO2 24  BUN 13  CREATININE 1.04*  GLUCOSE 161*  CALCIUM 8.5*   No results for input(s): LABPT, INR in the last 72 hours.  Neurologically intact ABD soft Neurovascular intact Sensation intact distally Intact pulses distally Dorsiflexion/Plantar flexion intact Incision: dressing C/D/I and no drainage No cellulitis present Compartment soft   Assessment/Plan: 1 Day Post-Op Procedure(s) (LRB): ARTHROPLASTY, KNEE, TOTAL (Left) Advance diet Up with therapy D/C IV fluids Plan D/C later today after passing PT as long as pain remains well controlled, home with HHPT   Patient's anticipated LOS is less than 2 midnights, meeting these requirements: - Younger than 88 - Lives within 1 hour of care - Has a competent adult at home to recover with post-op recover - NO history of  - Chronic pain requiring opiods  - Diabetes  - Coronary Artery Disease  - Heart failure  - Heart attack  - Stroke  - DVT/VTE  - Cardiac arrhythmia  - Respiratory Failure/COPD  -  Renal failure  - Anemia  - Advanced Liver disease     Martha Thomas 07/12/2024, 7:47 AM

## 2024-07-12 NOTE — Op Note (Signed)
 NAME: Martha Thomas, Martha N. MEDICAL RECORD NO: [id number] ACCOUNT NO: [id number] DATE OF BIRTH: 12-15-1976 FACILITY: [facility] LOCATION: [id number] PHYSICIAN: Reyes KYM Billing, MD  Operative Report   DATE OF PROCEDURE: 07/11/2024  PREOPERATIVE DIAGNOSIS: End-stage osteoarthrosis, varus deformity of the left knee.  POSTOPERATIVE DIAGNOSIS: End-stage osteoarthrosis, varus deformity of the left knee.  PROCEDURE PERFORMED:  Left total knee arthroplasty utilizing Attune DePuy rotating platform, 6 femur, 5 tibia, 8 mm insert, and 32 patella.  ANESTHESIA: Spinal.  ASSISTANT:  Jaclyn Bissell, PA.  HISTORY:  This is a 48 year old female with severe end-stage osteoarthrosis of the knees bilaterally, bone-on-bone varus deformity.  Refractive to conservative treatment, unable to ambulate.  She was indicated for replacement of the degenerated joint.   Risks and benefits discussed including bleeding, infection, damage to neurovascular structures, no change in symptoms, worsening symptoms, DVT, PE, anesthetic complications, etc.  DESCRIPTION OF PROCEDURE:  The patient was placed in the supine position after induction of adequate spinal anesthesia and 3 g of Kefzol .  The left lower extremity was prepped, draped, and exsanguinated in the usual sterile fashion.  Thigh tourniquet  inflated to 225 mmHg.  A midline incision was then made over the knee. Full-thickness flaps were developed.  Medial and parapatellar arthrotomy was then performed. Elevated soft tissues medially, preserving the MCL.  Osteophytes were removed from the  medial tibial plateau. Patella was gently everted.  The knee was flexed.  Severe tricompartmental osteoarthrosis was noted.  Synovectomy performed, debrided the fat pad. A Leksell was utilized to remove osteophytes and to fashion a notch above the  femoral notch as a starting hole for the femoral drill, which was drilled in line with the femur entering the canal. This was then irrigated and confirmed  with a T-handle.  I placed an intramedullary guide, 5-degree aid off the distal femur as she had  slight recurvatum.  This was pinned.  I performed a distal femoral cut.  I then sized the femur off the anterior cortex to be a 6.  Three degrees of external rotation pinned. I placed a distal femoral cutting block.  I performed anterior and posterior  chamfer cuts with soft tissues protected at all times.   Attention was turned towards the tibia, which was subluxed.  External alignment guide to 3mm off the defect which was medially, bisecting the tibiotalar joint 3-degree slope parallel to the shaft. This was pinned. I performed a tibial cut  protecting the soft tissues posteriorly.  Extension gap was then checked and it was symmetric. Slight recurvatum of the 6. I then reflexed the knee.  Completed the preparation of the tibia.  Sized to a 5 just the medial third of the patellar tendon.   Maximizing the coverage. Pinned. I harvested the bone centrally and then packed it into the distal femur.  Drilled centrally, used a punch guide.  Attention back to the femur.  We used our box cut guide bisecting the condyles in line with the intramedullary drill hole.  This was pinned.  I performed a box cut rasping the surfaces.  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  Bone sent as specimen.  I then placed a trial femur and used a 6 mm with slight recurvatum, had full extension, full flexion, good stability to varus and valgus stressing at 0 and 30 degrees.  Negative anterior drawer.  I then removed all components.  Checked posteriorly and remnants of the menisci excised,  cauterized the geniculates with the Aquamantys.  The popliteus and the capsule were intact.  Pulsatile lavage thoroughly cleaned the  joint. Cement was mixed on the back table under centrifuge and vacuum.  Knee was flexed.  All surfaces thoroughly dried. I placed drill  holes in the proximal medial tibia, which had sclerotic bone. Cement was then placed in the proximal tibia digitally  pressurizing it. Cement on the tibial component was impacted into place with redundant cement removed.  Cement was placed on the femur and the femoral component was impacted into place. It fit flush. I placed actually an 8-mm insert and reduced the knee  and held it in extension with an axial load throughout the curing of the cement.  Redundant cement removed.  I placed Marcaine  with epinephrine  and Prontosan in the wound and covered it during the curing of the cement, after which the tourniquet was  deflated at 55 minutes.  Any bleeding was cauterized with an Aquamantys.  Following this, I removed the insert.  I meticulously removed all redundant cement.  Copiously irrigated with pulsatile lavage followed by Prontosan. I placed an 8 mm insert and  reduced the knee and again, I had full extension, full flexion.  Good stability with varus and valgus stressing at 0 and 30 degrees.  Negative anterior drawer.  In mid flexion, I reapproximated the patellar arthrotomy with 1 Vicryl interrupted  figure-of-eight sutures oversewn with a running StrataFix.  I had excellent patellofemoral tracking.  Following that, I performed a slight lateral release.  Copiously irrigating the subcutaneous with multiple two layers and the skin with staples.  A  sterile dressing was applied, placed in an immobilizer, and transported to the recovery room in satisfactory condition.    The patient tolerated the procedure well with no complications.  Assistant, Jaclyn Bissell, PA is used throughout the case for patient positioning, gentle intermittent neural traction, and closure.  BLOOD LOSS:  50 mL.  SPECIMENS:  Bone, distal femur to pathology.   MUK D: 07/11/2024 1:34:28 pm T: 07/12/2024 12:17:00 am  JOB: [id number]/ [id number]

## 2024-07-12 NOTE — Anesthesia Postprocedure Evaluation (Signed)
 Anesthesia Post Note  Patient: Martha Thomas  Procedure(s) Performed: ARTHROPLASTY, KNEE, TOTAL (Left: Knee)     Patient location during evaluation: PACU Anesthesia Type: Regional and Spinal Level of consciousness: oriented and awake and alert Pain management: pain level controlled Vital Signs Assessment: post-procedure vital signs reviewed and stable Respiratory status: spontaneous breathing, respiratory function stable and patient connected to nasal cannula oxygen Cardiovascular status: blood pressure returned to baseline and stable Postop Assessment: no headache, no backache and no apparent nausea or vomiting Anesthetic complications: no   No notable events documented.  Last Vitals:  Vitals:   07/12/24 0617 07/12/24 0948  BP: (!) 148/91 (!) 152/88  Pulse: 91 (!) 106  Resp: 16 18  Temp: 37.1 C 36.8 C  SpO2: 97% 98%    Last Pain:  Vitals:   07/12/24 1131  TempSrc:   PainSc: 4                  Jacob Cicero L Cylie Dor

## 2024-07-12 NOTE — Plan of Care (Signed)

## 2024-07-12 NOTE — TOC Transition Note (Addendum)
 Transition of Care [facility]) - Discharge Note   Patient Details  Name: Martha Thomas MRN: [id number] Date of Birth: [birth year]  Transition of [facility]) CM/SW Contact:  Alfonse JONELLE Rex, RN Phone Number: 07/12/2024, 11:04 AM   Clinical Narrative:  Met with patient at bedside to review dc therapy and home equipment needs. Per PA note, referral sent for [facility] PT, 3N1 and RW. 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  NCM called to  [employer] name, Norman Gaskins at   [facility]) [phone], no answer, vm left with NCM name and phone number requesting call back. Patient provided NCM phone number to Randall Cone, RN Case Manager with [facility], [phone], Randall reports she is working on [employer], states order for RW and 3n1 was received on her desk yesterday. Randall inquired if NCM was able to arranged RW with a DME provider, NCM explained that other DME providers do not accept WC. Randall states she will work on arranging RW .   -11:34am Call received from [facility] with  [facility]'s DME, provided phone number to [employer] [facility] per his request to make arrange for delivery of RW.         Final next level of care: Home w Home Health Services     Patient Goals and CMS Choice Patient states their goals for this hospitalization and ongoing recovery are:: return home          Discharge Placement                       Discharge Plan and Services Additional resources added to the After Visit Summary for                                       Social Drivers of Health (SDOH) Interventions SDOH Screenings   Food Insecurity: No Food Insecurity (07/11/2024)  Housing: Low Risk  (07/11/2024)  Transportation Needs: No Transportation Needs (07/11/2024)  Utilities: Not At Risk (07/11/2024)  Tobacco Use: Medium Risk (07/11/2024)     Readmission Risk Interventions     No data to display

## 2024-07-12 NOTE — Progress Notes (Signed)
 Physical Therapy Treatment Patient Details Name: Martha Thomas MRN: [id number] DOB: [birth year] Today's Date: 07/12/2024   History of Present Illness 48 yo female presents to therapy s/p L TKA on 07/11/2024 due to failure of conservative measures. Pt reports sustaining fall 11/2023 at work and L knee dysfunction and pain since that time. Pt PMH includes but is not limited to: arthritis and HTN.    PT Comments  Pt continues cooperative but limited by lethargy - ?meds.  BP supine 139/81; sitting 139/95; standing 139/86; 3 min not achieved with pt requesting to sit 2* overwhelming fatigue.  Pt stood second time to side step to top of bed and assisted to supine, positioned for comfort and provided with ice packs.  Will follow in am.    If plan is discharge home, recommend the following: A little help with walking and/or transfers;A little help with bathing/dressing/bathroom;Assistance with cooking/housework;Assist for transportation;Help with stairs or ramp for entrance   Can travel by private vehicle        Equipment Recommendations  Rolling walker (2 wheels);BSC/3in1    Recommendations for Other Services       Precautions / Restrictions Precautions Precautions: Fall;Knee Restrictions Weight Bearing Restrictions Per Provider Order: No LLE Weight Bearing Per Provider Order: Weight bearing as tolerated     Mobility  Bed Mobility Overal bed mobility: Needs Assistance Bed Mobility: Supine to Sit, Sit to Supine     Supine to sit: Min assist, HOB elevated, Used rails Sit to supine: Min assist   General bed mobility comments: Min cues and min assist for management of L LE    Transfers Overall transfer level: Needs assistance Equipment used: Rolling walker (2 wheels) Transfers: Sit to/from Stand Sit to Stand: Min assist           General transfer comment: min cues for LE managemen and use of UEs to self assist    Ambulation/Gait Ambulation/Gait assistance: Min assist Gait  Distance (Feet): 3 Feet Assistive device: Rolling walker (2 wheels) Gait Pattern/deviations: Step-to pattern, Antalgic, Trunk flexed, Decreased stance time - left Gait velocity: decreased     General Gait Details: Pt side-stepped along EOB only - limited by lethargy   Stairs             Wheelchair Mobility     Tilt Bed    Modified Rankin (Stroke Patients Only)       Balance Overall balance assessment: Needs assistance, History of Falls Sitting-balance support: Feet supported Sitting balance-Leahy Scale: Good     Standing balance support: Bilateral upper extremity supported, During functional activity, Reliant on assistive device for balance Standing balance-Leahy Scale: Poor                              Communication Communication Communication: No apparent difficulties  Cognition Arousal: Lethargic, Suspect due to medications Behavior During Therapy: Flat affect   PT - Cognitive impairments: No apparent impairments                         Following commands: Intact      Cueing Cueing Techniques: Verbal cues  Exercises Total Joint Exercises Ankle Circles/Pumps: AROM, Both, 20 reps, Supine Quad Sets: AAROM, Both, 10 reps, Supine Heel Slides: AAROM, Left, 15 reps, Supine Straight Leg Raises: AAROM, Left, 10 reps, Supine    General Comments        Pertinent Vitals/Pain Pain Assessment Pain Assessment: 0-10  Pain Score: 4  Pain Location: L LE and knee Pain Descriptors / Indicators: Aching, Sore Pain Intervention(s): Limited activity within patient's tolerance, Monitored during session, Premedicated before session, Ice applied    Home Living                          Prior Function            PT Goals (current goals can now be found in the care plan section) Acute Rehab PT Goals Patient Stated Goal: to be able to walk with out a cane and get back to normal activities PT Goal Formulation: With patient Time For  Goal Achievement: 07/25/24 Potential to Achieve Goals: Good Progress towards PT goals: Not progressing toward goals - comment (lethargic)    Frequency    7X/week      PT Plan      Co-evaluation              AM-PAC PT 6 Clicks Mobility   Outcome Measure  Help needed turning from your back to your side while in a flat bed without using bedrails?: A Little Help needed moving from lying on your back to sitting on the side of a flat bed without using bedrails?: A Little Help needed moving to and from a bed to a chair (including a wheelchair)?: A Little Help needed standing up from a chair using your arms (e.g., wheelchair or bedside chair)?: A Little Help needed to walk in hospital room?: A Little Help needed climbing 3-5 steps with a railing? : Total 6 Click Score: 16    End of Session Equipment Utilized During Treatment: Gait belt Activity Tolerance: Patient limited by fatigue Patient left: in bed;with call bell/phone within reach;with bed alarm set Nurse Communication: Mobility status PT Visit Diagnosis: Unsteadiness on feet (R26.81);Other abnormalities of gait and mobility (R26.89);Muscle weakness (generalized) (M62.81);History of falling (Z91.81);Difficulty in walking, not elsewhere classified (R26.2);Pain Pain - Right/Left: Left Pain - part of body: Knee;Leg     Time: 8691-8669 PT Time Calculation (min) (ACUTE ONLY): 22 min  Charges:    $Therapeutic Activity: 8-22 mins                       [facility] PT Acute Rehabilitation Services Office [phone]    Granger Chui 07/12/2024, 1:51 PM

## 2024-07-12 NOTE — Progress Notes (Signed)
 Physical Therapy Treatment Patient Details Name: Martha Thomas MRN: [id number] DOB: 1976-10-31 Today's Date: 07/12/2024   History of Present Illness 48 yo female presents to therapy s/p L TKA on 07/11/2024 due to failure of conservative measures. Pt reports sustaining fall 11/2023 at work and L knee dysfunction and pain since that time. Pt PMH includes but is not limited to: arthritis and HTN.    PT Comments  Pt very cooperative but progress continues limited by c/o dizziness with mobility.  HEP initiated and pt up to ambulate limited distance into hallway - BP on return to sitting 153/91 - RN aware.    If plan is discharge home, recommend the following: A little help with walking and/or transfers;A little help with bathing/dressing/bathroom;Assistance with cooking/housework;Assist for transportation;Help with stairs or ramp for entrance   Can travel by private vehicle        Equipment Recommendations  Rolling walker (2 wheels);BSC/3in1    Recommendations for Other Services       Precautions / Restrictions Precautions Precautions: Fall;Knee Restrictions Weight Bearing Restrictions Per Provider Order: No LLE Weight Bearing Per Provider Order: Weight bearing as tolerated     Mobility  Bed Mobility Overal bed mobility: Needs Assistance Bed Mobility: Supine to Sit     Supine to sit: Min assist, HOB elevated, Used rails     General bed mobility comments: increased time and cues    Transfers Overall transfer level: Needs assistance Equipment used: Rolling walker (2 wheels) Transfers: Sit to/from Stand Sit to Stand: Min assist           General transfer comment: min cues pt able to push to stand with min A to power up    Ambulation/Gait Ambulation/Gait assistance: Contact guard assist Gait Distance (Feet): 19 Feet Assistive device: Rolling walker (2 wheels) Gait Pattern/deviations: Step-to pattern, Antalgic, Trunk flexed, Decreased stance time - left Gait velocity:  decreased     General Gait Details: cues for sequence, posture and position from RW; distance ltd by c/o dizziness - BP 153/91   Stairs             Wheelchair Mobility     Tilt Bed    Modified Rankin (Stroke Patients Only)       Balance Overall balance assessment: Needs assistance, History of Falls Sitting-balance support: Feet supported Sitting balance-Leahy Scale: Good     Standing balance support: Bilateral upper extremity supported, During functional activity, Reliant on assistive device for balance Standing balance-Leahy Scale: Poor                              Communication Communication Communication: No apparent difficulties  Cognition Arousal: Alert Behavior During Therapy: WFL for tasks assessed/performed   PT - Cognitive impairments: No apparent impairments                         Following commands: Intact      Cueing Cueing Techniques: Verbal cues  Exercises Total Joint Exercises Ankle Circles/Pumps: AROM, Both, 20 reps, Supine Quad Sets: AAROM, Both, 10 reps, Supine Heel Slides: AAROM, Left, 15 reps, Supine Straight Leg Raises: AAROM, Left, 10 reps, Supine    General Comments        Pertinent Vitals/Pain Pain Assessment Pain Assessment: 0-10 Pain Score: 6  Pain Location: L LE and knee Pain Descriptors / Indicators: Aching, Sore Pain Intervention(s): Limited activity within patient's tolerance, Monitored during session, Premedicated before  session, Ice applied    Home Living                          Prior Function            PT Goals (current goals can now be found in the care plan section) Acute Rehab PT Goals Patient Stated Goal: to be able to walk with out a cane and get back to normal activities PT Goal Formulation: With patient Time For Goal Achievement: 07/25/24 Potential to Achieve Goals: Good Progress towards PT goals: Progressing toward goals    Frequency    7X/week      PT  Plan      Co-evaluation              AM-PAC PT 6 Clicks Mobility   Outcome Measure  Help needed turning from your back to your side while in a flat bed without using bedrails?: A Little Help needed moving from lying on your back to sitting on the side of a flat bed without using bedrails?: A Little Help needed moving to and from a bed to a chair (including a wheelchair)?: A Little Help needed standing up from a chair using your arms (e.g., wheelchair or bedside chair)?: A Little Help needed to walk in hospital room?: A Little Help needed climbing 3-5 steps with a railing? : Total 6 Click Score: 16    End of Session Equipment Utilized During Treatment: Gait belt Activity Tolerance: Patient tolerated treatment well;Patient limited by fatigue Patient left: in chair;with call bell/phone within reach;with chair alarm set Nurse Communication: Mobility status PT Visit Diagnosis: Unsteadiness on feet (R26.81);Other abnormalities of gait and mobility (R26.89);Muscle weakness (generalized) (M62.81);History of falling (Z91.81);Difficulty in walking, not elsewhere classified (R26.2);Pain Pain - Right/Left: Left Pain - part of body: Knee;Leg     Time: 9164-9095 PT Time Calculation (min) (ACUTE ONLY): 29 min  Charges:    $Gait Training: 8-22 mins $Therapeutic Exercise: 8-22 mins PT General Charges $$ ACUTE PT VISIT: 1 Visit                     [facility] PT Acute Rehabilitation Services Office [phone]    Emir Nack 07/12/2024, 9:17 AM

## 2024-07-13 DIAGNOSIS — M1712 Unilateral primary osteoarthritis, left knee: Secondary | ICD-10-CM | POA: Diagnosis not present

## 2024-07-13 LAB — CBC
HCT: 30.8 % — ABNORMAL LOW (ref 36.0–46.0)
Hemoglobin: 9.7 g/dL — ABNORMAL LOW (ref 12.0–15.0)
MCH: 24 pg — ABNORMAL LOW (ref 26.0–34.0)
MCHC: 31.5 g/dL (ref 30.0–36.0)
MCV: 76 fL — ABNORMAL LOW (ref 80.0–100.0)
Platelets: 310 K/uL (ref 150–400)
RBC: 4.05 MIL/uL (ref 3.87–5.11)
RDW: 15.9 % — ABNORMAL HIGH (ref 11.5–15.5)
WBC: 7.9 K/uL (ref 4.0–10.5)
nRBC: 0 % (ref 0.0–0.2)

## 2024-07-13 NOTE — Discharge Summary (Signed)
 Physician Discharge Summary   Patient ID: Martha Thomas MRN: [id number] DOB/AGE: 08-05-1976 48 y.o.  Admit date: 07/11/2024 Discharge date: 07/13/24  Primary Diagnosis: left knee primary osteoarthritis  Admission Diagnoses:  Past Medical History:  Diagnosis Date   Arthritis    Hypertension    Discharge Diagnoses:   Principal Problem:   S/P TKR (total knee replacement) using cement  Estimated body mass index is 40.22 kg/m as calculated from the following:   Height as of this encounter: 5' 3 (1.6 m).   Weight as of this encounter: 103 kg.  Procedure:  Procedure(s) (LRB): ARTHROPLASTY, KNEE, TOTAL (Left)   Consults: None  HPI: see pre-op H&P  Laboratory Data: Admission on 07/11/2024  Component Date Value Ref Range Status   Preg Test, Ur 07/11/2024 NEGATIVE  NEGATIVE Final   Comment:        THE SENSITIVITY OF THIS METHODOLOGY IS >20 mIU/mL.    WBC 07/12/2024 9.4  4.0 - 10.5 K/uL Final   RBC 07/12/2024 4.63  3.87 - 5.11 MIL/uL Final   Hemoglobin 07/12/2024 11.1 (L)  12.0 - 15.0 g/dL Final   HCT 92/82/7974 35.6 (L)  36.0 - 46.0 % Final   MCV 07/12/2024 76.9 (L)  80.0 - 100.0 fL Final   MCH 07/12/2024 24.0 (L)  26.0 - 34.0 pg Final   MCHC 07/12/2024 31.2  30.0 - 36.0 g/dL Final   RDW 92/82/7974 15.6 (H)  11.5 - 15.5 % Final   Platelets 07/12/2024 322  150 - 400 K/uL Final   nRBC 07/12/2024 0.0  0.0 - 0.2 % Final   Performed at [facility], [address] [place], [place] [address]   Sodium 07/12/2024 134 (L)  135 - 145 mmol/L Final   Potassium 07/12/2024 3.2 (L)  3.5 - 5.1 mmol/L Final   Chloride 07/12/2024 96 (L)  98 - 111 mmol/L Final   CO2 07/12/2024 24  22 - 32 mmol/L Final   Glucose, Bld 07/12/2024 161 (H)  70 - 99 mg/dL Final   Glucose reference range applies only to samples taken after fasting for at least 8 hours.   BUN 07/12/2024 13  6 - 20 mg/dL Final   Creatinine, Ser 07/12/2024 1.04 (H)  0.44 - 1.00 mg/dL Final   Calcium 92/82/7974  8.5 (L)  8.9 - 10.3 mg/dL Final   GFR, Estimated 07/12/2024 >60  >60 mL/min Final   Comment: (NOTE) Calculated using the CKD-EPI Creatinine Equation (2021)    Anion gap 07/12/2024 14  5 - 15 Final   Performed at [facility], [address], [place], [place] [address]   WBC 07/13/2024 7.9  4.0 - 10.5 K/uL Final   RBC 07/13/2024 4.05  3.87 - 5.11 MIL/uL Final   Hemoglobin 07/13/2024 9.7 (L)  12.0 - 15.0 g/dL Final   HCT 92/81/7974 30.8 (L)  36.0 - 46.0 % Final   MCV 07/13/2024 76.0 (L)  80.0 - 100.0 fL Final   MCH 07/13/2024 24.0 (L)  26.0 - 34.0 pg Final   MCHC 07/13/2024 31.5  30.0 - 36.0 g/dL Final   RDW 92/81/7974 15.9 (H)  11.5 - 15.5 % Final   Platelets 07/13/2024 310  150 - 400 K/uL Final   nRBC 07/13/2024 0.0  0.0 - 0.2 % Final   Performed at [facility], [address] [place], [place] [address]  Hospital Outpatient Visit on 07/06/2024  Component Date Value Ref Range Status   Sodium 07/06/2024 139  135 -  145 mmol/L Final   Potassium 07/06/2024 3.3 (L)  3.5 - 5.1 mmol/L Final   Chloride 07/06/2024 98  98 - 111 mmol/L Final   CO2 07/06/2024 31  22 - 32 mmol/L Final   Glucose, Bld 07/06/2024 121 (H)  70 - 99 mg/dL Final   Glucose reference range applies only to samples taken after fasting for at least 8 hours.   BUN 07/06/2024 16  6 - 20 mg/dL Final   Creatinine, Ser 07/06/2024 1.23 (H)  0.44 - 1.00 mg/dL Final   Calcium 92/88/7974 9.3  8.9 - 10.3 mg/dL Final   GFR, Estimated 07/06/2024 54 (L)  >60 mL/min Final   Comment: (NOTE) Calculated using the CKD-EPI Creatinine Equation (2021)    Anion gap 07/06/2024 10  5 - 15 Final   Performed at [facility], [address], [place], [place] [address]   MRSA, PCR 07/06/2024 NEGATIVE  NEGATIVE Final   Staphylococcus aureus 07/06/2024 NEGATIVE  NEGATIVE Final   Comment: (NOTE) The Xpert SA Assay (FDA approved for NASAL specimens in patients 39 years of age and older), is  one component of a comprehensive surveillance program. It is not intended to diagnose infection nor to guide or monitor treatment. Performed at [facility], [address] [place], [place] [address]    WBC 07/06/2024 4.4  4.0 - 10.5 K/uL Final   RBC 07/06/2024 4.76  3.87 - 5.11 MIL/uL Final   Hemoglobin 07/06/2024 11.0 (L)  12.0 - 15.0 g/dL Final   HCT 92/88/7974 36.9  36.0 - 46.0 % Final   MCV 07/06/2024 77.5 (L)  80.0 - 100.0 fL Final   MCH 07/06/2024 23.1 (L)  26.0 - 34.0 pg Final   MCHC 07/06/2024 29.8 (L)  30.0 - 36.0 g/dL Final   RDW 92/88/7974 15.9 (H)  11.5 - 15.5 % Final   Platelets 07/06/2024 303  150 - 400 K/uL Final   nRBC 07/06/2024 0.0  0.0 - 0.2 % Final   Performed at [facility], [address], [place], [place] [address]     X-Rays:DG Knee 1-2 Views Left Result Date: 07/11/2024 CLINICAL DATA:  [id number] S/P TKR (total knee replacement) using cement [id number] EXAM: LEFT KNEE - 1-2 VIEW COMPARISON:  None Available. FINDINGS: No acute fracture or dislocation. No aggressive osseous lesion. Baseline examination status post left total knee arthroplasty with patellar resurfacing. The hardware is intact. No periprosthetic fracture or lucency. There is near anatomic alignment. No knee effusion or focal soft tissue swelling. Anterior skin staples noted. No radiopaque foreign bodies. IMPRESSION: *Baseline examination status post left total knee arthroplasty with patellar resurfacing. No evidence of hardware complication. Electronically Signed   By: Ree Molt M.D.   On: 07/11/2024 14:20    EKG: Orders placed or performed during the hospital encounter of 09/11/23   EKG 12-Lead   EKG 12-Lead     Hospital Course: Martha Thomas is a 48 y.o. who was admitted to [facility]. They were brought to the operating room on 07/11/2024 and underwent Procedure(s): ARTHROPLASTY, KNEE, TOTAL. They were brought to the operating room on 07/11/2024 and underwent Procedure(s): ARTHROPLASTY, KNEE, TOTAL.  Patient tolerated the procedure well and was later  transferred to the recovery room and then to the orthopaedic floor for postoperative care.  They were given PO and IV analgesics for pain control following their surgery.  They were given 24 hours of postoperative antibiotics of  Anti-infectives (From admission, onward)    Start     Dose/Rate Route Frequency Ordered Stop   07/11/24 1730  ceFAZolin  (ANCEF ) IVPB 2g/100 mL premix        2 g 200 mL/hr over 30 Minutes Intravenous Every 6 hours 07/11/24 1626 07/12/24 0037   07/11/24 0945  ceFAZolin  (ANCEF ) IVPB 2g/100 mL premix        2 g 200 mL/hr over 30 Minutes Intravenous On call to O.R. 07/11/24 0944 07/11/24 1142      and started on DVT prophylaxis in the form of Aspirin , TED hose, and SCDs.   PT and OT were ordered for total joint protocol.  Discharge planning consulted to help with postop disposition and equipment needs.  Patient had a fair night on the evening of surgery.  They started to get up OOB with therapy on day one. Continued to work with therapy into day two. By day two, the patient had progressed with therapy and meeting their goals.  Incision was healing well.  Patient was seen in rounds and was ready to go home.   Diet: Regular diet Activity:WBAT Follow-up:in 10-14 days Disposition - Home Discharged Condition: good   Discharge Instructions     Call MD / Call 911   Complete by: As directed    If you experience chest pain or shortness of breath, CALL 911 and be transported to the hospital emergency room.  If you develope a fever above 101 F, pus (white drainage) or increased drainage or redness at the wound, or calf pain, call your surgeon's office.   Constipation Prevention   Complete by: As directed    Drink plenty of fluids.  Prune juice may be helpful.  You may use a stool softener, such as Colace (over the counter) 100 mg twice a day.  Use MiraLax  (over the counter) for constipation as needed.   Diet - low sodium heart healthy   Complete by: As directed    Increase  activity slowly as tolerated   Complete by: As directed    Post-operative opioid taper instructions:   Complete by: As directed    POST-OPERATIVE OPIOID TAPER INSTRUCTIONS: It is important to wean off of your opioid medication as soon as possible. If you do not need pain medication after your surgery it is ok to stop day one. Opioids include: Codeine, Hydrocodone(Norco, Vicodin), Oxycodone (Percocet, oxycontin ) and hydromorphone  amongst others.  Long term and even short term use of opiods can cause: Increased pain response Dependence Constipation Depression Respiratory depression And more.  Withdrawal symptoms can include Flu like symptoms Nausea, vomiting And more Techniques to manage these symptoms Hydrate well Eat regular healthy meals Stay active Use relaxation techniques(deep breathing, meditating, yoga) Do Not substitute Alcohol to help with tapering If you have been on opioids for less than two weeks and do not have pain than it is ok to stop all together.  Plan to wean off of opioids This plan should start within one week post op of your joint replacement. Maintain the same interval or time between taking each dose and first decrease the dose.  Cut the total daily intake of opioids by one tablet each day Next start to increase the time between doses. The last dose that should be eliminated is the evening dose.         Allergies as of 07/13/2024   No Known Allergies      Medication List     TAKE these medications    acetaminophen  500 MG tablet Commonly known as: TYLENOL  Take 500 mg by mouth 2 (two) times daily as needed for mild pain (pain score  1-3) or moderate pain (pain score 4-6).   amLODipine  10 MG tablet Commonly known as: NORVASC  Take 10 mg by mouth daily.   amLODipine -benazepril  10-40 MG capsule Commonly known as: LOTREL Take 1 capsule by mouth daily.   aspirin  EC 81 MG tablet Take 1 tablet (81 mg total) by mouth 2 (two) times daily after a  meal. Day after surgery   B-complex with vitamin C tablet Take 1 tablet by mouth daily.   diphenhydramine -acetaminophen  25-500 MG Tabs tablet Commonly known as: TYLENOL  PM Take 1 tablet by mouth at bedtime.   docusate sodium  100 MG capsule Commonly known as: Colace Take 1 capsule (100 mg total) by mouth 2 (two) times daily.   hydrochlorothiazide  25 MG tablet Commonly known as: HYDRODIURIL  Take 1 tablet (25 mg total) by mouth daily.   Oxycodone  HCl 10 MG Tabs Take 1 tablet (10 mg total) by mouth every 4 (four) hours as needed for up to 7 days.   polyethylene glycol 17 g packet Commonly known as: MIRALAX  / GLYCOLAX  Take 17 g by mouth daily.        Follow-up Information     Duwayne Purchase, MD Follow up in 2 week(s).   Specialty: Orthopedic Surgery Contact information: [address] [place] [address] [phone]                 Signed: Daionna Crossland, PA-C Orthopaedic Surgery 07/13/2024, 10:59 AM

## 2024-07-13 NOTE — Progress Notes (Signed)
 Subjective: 2 Days Post-Op Procedure(s) (LRB): ARTHROPLASTY, KNEE, TOTAL (Left) Patient reports pain as moderate.    Objective: Vital signs in last 24 hours: Temp:  [98.3 F (36.8 C)-99.1 F (37.3 C)] 98.9 F (37.2 C) (07/18 0539) Pulse Rate:  [93-106] 93 (07/18 0539) Resp:  [16-18] 16 (07/18 0539) BP: (147-152)/(81-91) 147/91 (07/18 0539) SpO2:  [95 %-100 %] 95 % (07/18 0539)  Intake/Output from previous day: 07/17 0701 - 07/18 0700 In: 1200 [P.O.:1200] Out: 1025 [Urine:1000; Emesis/NG output:25] Intake/Output this shift: No intake/output data recorded.  Recent Labs    07/12/24 0345 07/13/24 0339  HGB 11.1* 9.7*   Recent Labs    07/12/24 0345 07/13/24 0339  WBC 9.4 7.9  RBC 4.63 4.05  HCT 35.6* 30.8*  PLT 322 310   Recent Labs    07/12/24 0345  NA 134*  K 3.2*  CL 96*  CO2 24  BUN 13  CREATININE 1.04*  GLUCOSE 161*  CALCIUM 8.5*   No results for input(s): LABPT, INR in the last 72 hours.  Neurologically intact ABD soft Neurovascular intact Sensation intact distally Intact pulses distally Dorsiflexion/Plantar flexion intact Incision: dressing C/D/I and no drainage No cellulitis present Compartment soft No calf pain or sign of DVT   Assessment/Plan: 2 Days Post-Op Procedure(s) (LRB): ARTHROPLASTY, KNEE, TOTAL (Left) Advance diet Up with therapy D/C IV fluids Remove ACE bandage today switch to TED stocking Anticipate D/C later today as long as passes PT   Martha Thomas M Martha Thomas 07/13/2024, 8:36 AM

## 2024-07-13 NOTE — Plan of Care (Signed)
   Problem: Education: Goal: Knowledge of General Education information will improve Description Including pain rating scale, medication(s)/side effects and non-pharmacologic comfort measures Outcome: Progressing   Problem: Health Behavior/Discharge Planning: Goal: Ability to manage health-related needs will improve Outcome: Progressing

## 2024-07-13 NOTE — Progress Notes (Signed)
 Physical Therapy Treatment Patient Details Name: Martha Thomas MRN: [id number] DOB: 1976/03/21 Today's Date: 07/13/2024   History of Present Illness 48 yo female presents to therapy s/p L TKA on 07/11/2024 due to failure of conservative measures. Pt reports sustaining fall 11/2023 at work and L knee dysfunction and pain since that time. Pt PMH includes but is not limited to: arthritis and HTN.    PT Comments  Pt continues very cooperative and with noted improvement in activity tolerance this pm. Up to ambulate increased distance in hall and with no c/o dizziness.  Pt reports no HHPT in place yet so HEP provided in writing.      If plan is discharge home, recommend the following: A little help with walking and/or transfers;A little help with bathing/dressing/bathroom;Assistance with cooking/housework;Assist for transportation;Help with stairs or ramp for entrance   Can travel by private vehicle        Equipment Recommendations  Rolling walker (2 wheels);BSC/3in1    Recommendations for Other Services       Precautions / Restrictions Precautions Precautions: Fall;Knee Restrictions Weight Bearing Restrictions Per Provider Order: No LLE Weight Bearing Per Provider Order: Weight bearing as tolerated     Mobility  Bed Mobility Overal bed mobility: Needs Assistance Bed Mobility: Supine to Sit, Sit to Supine     Supine to sit: Supervision Sit to supine: Supervision   General bed mobility comments: Increased time with pt self assisting R LE with gait belt    Transfers Overall transfer level: Needs assistance Equipment used: Rolling walker (2 wheels) Transfers: Sit to/from Stand Sit to Stand: Supervision           General transfer comment: min cues for LE management and use of UEs to self assist    Ambulation/Gait Ambulation/Gait assistance: Contact guard assist, Supervision Gait Distance (Feet): 76 Feet Assistive device: Rolling walker (2 wheels) Gait  Pattern/deviations: Step-to pattern, Antalgic, Trunk flexed, Decreased stance time - left Gait velocity: decreased     General Gait Details: Increased time with min cues for sequence, posture and position from RW. No c/o dizziness   Stairs             Wheelchair Mobility     Tilt Bed    Modified Rankin (Stroke Patients Only)       Balance Overall balance assessment: Needs assistance, History of Falls Sitting-balance support: Feet supported Sitting balance-Leahy Scale: Good     Standing balance support: No upper extremity supported Standing balance-Leahy Scale: Fair                              Hotel manager: No apparent difficulties  Cognition Arousal: Alert Behavior During Therapy: Flat affect   PT - Cognitive impairments: No apparent impairments                         Following commands: Intact      Cueing Cueing Techniques: Verbal cues  Exercises Total Joint Exercises Ankle Circles/Pumps: AROM, Both, 20 reps, Supine Quad Sets: AAROM, Both, 10 reps, Supine Heel Slides: AAROM, Left, 15 reps, Supine Straight Leg Raises: AAROM, Left, Supine, 15 reps    General Comments        Pertinent Vitals/Pain Pain Assessment Pain Assessment: 0-10 Pain Score: 5  Pain Location: L LE and knee Pain Descriptors / Indicators: Aching, Sore Pain Intervention(s): Limited activity within patient's tolerance, Monitored during session, Premedicated before session,  Ice applied    Home Living                          Prior Function            PT Goals (current goals can now be found in the care plan section) Acute Rehab PT Goals Patient Stated Goal: to be able to walk with out a cane and get back to normal activities PT Goal Formulation: With patient Time For Goal Achievement: 07/25/24 Potential to Achieve Goals: Good Progress towards PT goals: Progressing toward goals    Frequency     7X/week      PT Plan      Co-evaluation              AM-PAC PT 6 Clicks Mobility   Outcome Measure  Help needed turning from your back to your side while in a flat bed without using bedrails?: A Little Help needed moving from lying on your back to sitting on the side of a flat bed without using bedrails?: A Little Help needed moving to and from a bed to a chair (including a wheelchair)?: A Little Help needed standing up from a chair using your arms (e.g., wheelchair or bedside chair)?: A Little Help needed to walk in hospital room?: A Little Help needed climbing 3-5 steps with a railing? : A Lot 6 Click Score: 17    End of Session Equipment Utilized During Treatment: Gait belt Activity Tolerance: Patient tolerated treatment well;Patient limited by fatigue Patient left: in bed;with call bell/phone within reach;with bed alarm set Nurse Communication: Mobility status PT Visit Diagnosis: Unsteadiness on feet (R26.81);Other abnormalities of gait and mobility (R26.89);Muscle weakness (generalized) (M62.81);History of falling (Z91.81);Difficulty in walking, not elsewhere classified (R26.2);Pain Pain - Right/Left: Left Pain - part of body: Knee;Leg     Time: 1345-1414 PT Time Calculation (min) (ACUTE ONLY): 29 min  Charges:    $Gait Training: 23-37 mins $Therapeutic Exercise: 8-22 mins $Therapeutic Activity: 8-22 mins PT General Charges $$ ACUTE PT VISIT: 1 Visit                     [facility] PT Acute Rehabilitation Services Office [phone]    Andrzej Scully 07/13/2024, 2:20 PM

## 2024-07-13 NOTE — Progress Notes (Signed)
 Physical Therapy Treatment Patient Details Name: Martha Thomas MRN: [id number] DOB: [birth year] Today's Date: 07/13/2024   History of Present Illness 48 yo female presents to therapy s/p L TKA on 07/11/2024 due to failure of conservative measures. Pt reports sustaining fall 11/2023 at work and L knee dysfunction and pain since that time. Pt PMH includes but is not limited to: arthritis and HTN.    PT Comments  Pt continues very cooperative but progress with mobility limited by c/o increasing dizziness with ambulation - BP 150/37.  This am, pt performed therex program with assist, required decreased assist for bed mobility and up to ambulate limited distance in hall.  Pt reports no word on if HHPT has been arranged.    If plan is discharge home, recommend the following: A little help with walking and/or transfers;A little help with bathing/dressing/bathroom;Assistance with cooking/housework;Assist for transportation;Help with stairs or ramp for entrance   Can travel by private vehicle        Equipment Recommendations  Rolling walker (2 wheels);BSC/3in1    Recommendations for Other Services       Precautions / Restrictions Precautions Precautions: Fall;Knee Restrictions Weight Bearing Restrictions Per Provider Order: No LLE Weight Bearing Per Provider Order: Weight bearing as tolerated     Mobility  Bed Mobility Overal bed mobility: Needs Assistance Bed Mobility: Supine to Sit, Sit to Supine     Supine to sit: Contact guard Sit to supine: Min assist   General bed mobility comments: Increased time with pt self assisting R LE with gait belt    Transfers Overall transfer level: Needs assistance Equipment used: Rolling walker (2 wheels) Transfers: Sit to/from Stand Sit to Stand: Contact guard assist           General transfer comment: min cues for LE management and use of UEs to self assist    Ambulation/Gait Ambulation/Gait assistance: Contact guard assist Gait  Distance (Feet): 30 Feet (twice with seated rest break between) Assistive device: Rolling walker (2 wheels) Gait Pattern/deviations: Step-to pattern, Antalgic, Trunk flexed, Decreased stance time - left Gait velocity: decreased     General Gait Details: min cues for sequence, posture and position from RW.  Distance ltd by c/o dizziness and need to sit - BP 150/87   Stairs             Wheelchair Mobility     Tilt Bed    Modified Rankin (Stroke Patients Only)       Balance Overall balance assessment: Needs assistance, History of Falls Sitting-balance support: Feet supported Sitting balance-Leahy Scale: Good     Standing balance support: Single extremity supported Standing balance-Leahy Scale: Poor                              Communication Communication Communication: No apparent difficulties  Cognition Arousal: Alert Behavior During Therapy: Flat affect   PT - Cognitive impairments: No apparent impairments                         Following commands: Intact      Cueing Cueing Techniques: Verbal cues  Exercises Total Joint Exercises Ankle Circles/Pumps: AROM, Both, 20 reps, Supine Quad Sets: AAROM, Both, 10 reps, Supine Heel Slides: AAROM, Left, 15 reps, Supine Straight Leg Raises: AAROM, Left, Supine, 15 reps    General Comments        Pertinent Vitals/Pain Pain Assessment Pain Assessment: 0-10 Pain Score:  5  Pain Location: L LE and knee Pain Descriptors / Indicators: Aching, Sore Pain Intervention(s): Limited activity within patient's tolerance, Monitored during session, Premedicated before session, Ice applied    Home Living                          Prior Function            PT Goals (current goals can now be found in the care plan section) Acute Rehab PT Goals Patient Stated Goal: to be able to walk with out a cane and get back to normal activities PT Goal Formulation: With patient Time For Goal  Achievement: 07/25/24 Potential to Achieve Goals: Good Progress towards PT goals: Progressing toward goals    Frequency    7X/week      PT Plan      Co-evaluation              AM-PAC PT 6 Clicks Mobility   Outcome Measure  Help needed turning from your back to your side while in a flat bed without using bedrails?: A Little Help needed moving from lying on your back to sitting on the side of a flat bed without using bedrails?: A Little Help needed moving to and from a bed to a chair (including a wheelchair)?: A Little Help needed standing up from a chair using your arms (e.g., wheelchair or bedside chair)?: A Little Help needed to walk in hospital room?: A Little Help needed climbing 3-5 steps with a railing? : A Lot 6 Click Score: 17    End of Session Equipment Utilized During Treatment: Gait belt Activity Tolerance: Patient limited by fatigue (c/o dizziness) Patient left: in bed;with call bell/phone within reach;with bed alarm set Nurse Communication: Mobility status PT Visit Diagnosis: Unsteadiness on feet (R26.81);Other abnormalities of gait and mobility (R26.89);Muscle weakness (generalized) (M62.81);History of falling (Z91.81);Difficulty in walking, not elsewhere classified (R26.2);Pain Pain - Right/Left: Left Pain - part of body: Knee;Leg     Time: 1020-1100 PT Time Calculation (min) (ACUTE ONLY): 40 min  Charges:    $Gait Training: 8-22 mins $Therapeutic Exercise: 8-22 mins $Therapeutic Activity: 8-22 mins PT General Charges $$ ACUTE PT VISIT: 1 Visit                     [facility] PT Acute Rehabilitation Services Office [phone]    Martha Thomas 07/13/2024, 11:55 AM

## 2024-07-16 LAB — SURGICAL PATHOLOGY
